# Patient Record
Sex: Female | Born: 1955 | Race: White | Hispanic: Yes | Marital: Married | Smoking: Never smoker
Health system: Southern US, Community
[De-identification: ages and names within clinical notes are randomized; demographics above are authoritative.]

## PROBLEM LIST (undated history)

## (undated) DIAGNOSIS — M1990 Unspecified osteoarthritis, unspecified site: Secondary | ICD-10-CM

## (undated) HISTORY — PX: ABDOMINAL HYSTERECTOMY: SHX81

## (undated) HISTORY — PX: KNEE CARTILAGE SURGERY: SHX688

## (undated) HISTORY — DX: Unspecified osteoarthritis, unspecified site: M19.90

## (undated) HISTORY — PX: TONSILECTOMY/ADENOIDECTOMY WITH MYRINGOTOMY: SHX6125

---

## 1978-07-23 HISTORY — PX: PARTIAL HYSTERECTOMY: SHX80

## 2016-01-25 DIAGNOSIS — E559 Vitamin D deficiency, unspecified: Secondary | ICD-10-CM | POA: Insufficient documentation

## 2016-08-14 DIAGNOSIS — K635 Polyp of colon: Secondary | ICD-10-CM | POA: Insufficient documentation

## 2016-11-22 LAB — HM COLONOSCOPY

## 2019-09-08 LAB — HM MAMMOGRAPHY

## 2020-03-01 ENCOUNTER — Telehealth: Payer: Self-pay

## 2020-03-01 ENCOUNTER — Other Ambulatory Visit: Payer: Self-pay

## 2020-03-01 ENCOUNTER — Ambulatory Visit
Admission: RE | Admit: 2020-03-01 | Discharge: 2020-03-01 | Disposition: A | Discharge status: Home / Self Care | Payer: Medicare Other | Attending: Internal Medicine | Admitting: Internal Medicine

## 2020-03-01 ENCOUNTER — Ambulatory Visit: Payer: Self-pay | Admitting: Internal Medicine

## 2020-03-01 ENCOUNTER — Ambulatory Visit
Admission: RE | Admit: 2020-03-01 | Discharge: 2020-03-01 | Disposition: A | Discharge status: Home / Self Care | Payer: Medicare Other | Source: Ambulatory Visit | Attending: Internal Medicine | Admitting: Internal Medicine

## 2020-03-01 ENCOUNTER — Encounter: Payer: Self-pay | Admitting: Internal Medicine

## 2020-03-01 ENCOUNTER — Ambulatory Visit (INDEPENDENT_AMBULATORY_CARE_PROVIDER_SITE_OTHER): Payer: Medicare Other | Admitting: Internal Medicine

## 2020-03-01 VITALS — BP 122/68 | HR 86 | Ht 62.0 in | Wt 142.0 lb

## 2020-03-01 DIAGNOSIS — Z789 Other specified health status: Secondary | ICD-10-CM

## 2020-03-01 DIAGNOSIS — F5101 Primary insomnia: Secondary | ICD-10-CM | POA: Diagnosis not present

## 2020-03-01 DIAGNOSIS — M545 Low back pain, unspecified: Secondary | ICD-10-CM

## 2020-03-01 DIAGNOSIS — IMO0001 Reserved for inherently not codable concepts without codable children: Secondary | ICD-10-CM | POA: Insufficient documentation

## 2020-03-01 DIAGNOSIS — M810 Age-related osteoporosis without current pathological fracture: Secondary | ICD-10-CM

## 2020-03-01 DIAGNOSIS — M8000XA Age-related osteoporosis with current pathological fracture, unspecified site, initial encounter for fracture: Secondary | ICD-10-CM | POA: Insufficient documentation

## 2020-03-01 DIAGNOSIS — M79672 Pain in left foot: Secondary | ICD-10-CM | POA: Diagnosis not present

## 2020-03-01 DIAGNOSIS — M1712 Unilateral primary osteoarthritis, left knee: Secondary | ICD-10-CM

## 2020-03-01 MED ORDER — BACLOFEN 10 MG PO TABS: 10.0000 mg | ORAL_TABLET | Freq: Every evening | ORAL | 0 refills | Status: DC

## 2020-03-01 MED ORDER — BELSOMRA 10 MG PO TABS: 1.0000 | ORAL_TABLET | Freq: Every evening | ORAL | 0 refills | Status: DC | PRN

## 2020-03-01 NOTE — Progress Notes (Signed)
Date:  03/01/2020   Name:  Roberta Ward   DOB:  [birth year]   MRN:  [id number]   Chief Complaint: Establish Care (New patient. From [place]. ), Back Pain (Lower back pain. Larey Seat off a ladder and landed on her back a year ago. Been getting worse in the last 6 months. Just moved to [place] a month ago and believes the moving aggervated the pain more. Orthopedics fromt he fall did XRAYS - no fracture at the time. ), Foot Pain (X 1 month. Painful on the top of your foot- constant. ), and Insomnia (Difficulty sleeping for at least the last 15 years. Takes magnesium and melatonin. Sometimes takes allergy meds to try to fall asleep. Still does not always help. )  Back Pain This is a chronic problem. The current episode started more than 1 year ago. The problem has been gradually worsening since onset. The pain is present in the lumbar spine. The quality of the pain is described as aching. The pain does not radiate. The pain is moderate. Pertinent negatives include no abdominal pain, chest pain, fever or headaches. Risk factors include history of osteoporosis (started after a fall last year; xrays were negative). She has tried analgesics (tylenol) for the symptoms. The treatment provided mild relief.  Foot Pain This is a chronic problem. The current episode started more than 1 month ago. The problem occurs every several days (left foot). Associated symptoms include arthralgias. Pertinent negatives include no abdominal pain, chest pain, chills, fatigue, fever, headaches, joint swelling or rash. The symptoms are aggravated by walking. She has tried acetaminophen for the symptoms. The treatment provided mild relief.  Insomnia Primary symptoms: sleep disturbance, difficulty falling asleep.  Episode onset: for many years. Past treatments include medication (temazepam years ago; more recently benadryl and melatonin). How long after going to bed to you fall asleep: over an hour.      Review of Systems    Constitutional: Negative for chills, fatigue and fever.  Eyes: Negative for visual disturbance.  Respiratory: Negative for chest tightness and shortness of breath.   Cardiovascular: Negative for chest pain and palpitations.  Gastrointestinal: Negative for abdominal pain, constipation and diarrhea.  Musculoskeletal: Positive for arthralgias, back pain and gait problem. Negative for joint swelling.  Skin: Negative for color change and rash.  Neurological: Negative for dizziness, light-headedness and headaches.  Psychiatric/Behavioral: Positive for sleep disturbance. Negative for dysphoric mood. The patient has insomnia. The patient is not nervous/anxious.     There are no problems to display for this patient.   Allergies  Allergen Reactions  . Sulfa Antibiotics Hives and Itching  . Latex     Past Surgical History:  Procedure Laterality Date  . KNEE CARTILAGE SURGERY Left   . PARTIAL HYSTERECTOMY  1980   Rt ovary still present and cervix still present.  . TONSILECTOMY/ADENOIDECTOMY WITH MYRINGOTOMY      Social History   Tobacco Use  . Smoking status: Never Smoker  . Smokeless tobacco: Never Used  Vaping Use  . Vaping Use: Never used  Substance Use Topics  . Alcohol use: Yes    Comment: rare- occasional  . Drug use: Never     Medication list has been reviewed and updated.  Current Meds  Medication Sig  . Ascorbic Acid (VITAMIN C) 1000 MG tablet Take 1,000 mg by mouth daily.  . Cholecalciferol (VITAMIN D) 50 MCG (2000 UT) CAPS Take by mouth.  . magnesium 30 MG tablet Take 30 mg by mouth  2 (two) times daily.  . Melatonin 10 MG CAPS Take by mouth.    PHQ 2/9 Scores 03/01/2020  PHQ - 2 Score 0  PHQ- 9 Score 6    GAD 7 : Generalized Anxiety Score 03/01/2020  Nervous, Anxious, on Edge 0  Control/stop worrying 0  Worry too much - different things 0  Trouble relaxing 2  Restless 3  Easily annoyed or irritable 0  Afraid - awful might happen 0  Total GAD 7 Score 5   Anxiety Difficulty Not difficult at all    BP Readings from Last 3 Encounters:  03/01/20 122/68    Physical Exam Vitals and nursing note reviewed.  Constitutional:      General: She is not in acute distress.    Appearance: She is well-developed.  HENT:     Head: Normocephalic and atraumatic.  Neck:     Vascular: No carotid bruit.  Cardiovascular:     Rate and Rhythm: Normal rate and regular rhythm.     Pulses: Normal pulses.     Heart sounds: No murmur heard.   Pulmonary:     Effort: Pulmonary effort is normal. No respiratory distress.     Breath sounds: No wheezing or rhonchi.  Musculoskeletal:     Cervical back: Normal range of motion.     Lumbar back: No tenderness or bony tenderness. Negative right straight leg raise test and negative left straight leg raise test.     Right lower leg: No edema.     Left lower leg: No edema.     Left foot: Tenderness (of the mid foot) present. No swelling, deformity or bunion.  Lymphadenopathy:     Cervical: No cervical adenopathy.  Skin:    General: Skin is warm and dry.     Findings: No rash.  Neurological:     General: No focal deficit present.     Mental Status: She is alert and oriented to person, place, and time.     Sensory: Sensation is intact.     Motor: Motor function is intact.     Deep Tendon Reflexes:     Reflex Scores:      Bicep reflexes are 2+ on the right side and 2+ on the left side.      Patellar reflexes are 2+ on the right side and 2+ on the left side. Psychiatric:        Behavior: Behavior normal.        Thought Content: Thought content normal.     Wt Readings from Last 3 Encounters:  03/01/20 142 lb (64.4 kg)    BP 122/68   Pulse 86   Ht 5\' 2"  (1.575 m)   Wt 142 lb (64.4 kg)   SpO2 97%   BMI 25.97 kg/m   Assessment and Plan: 1. Lumbar back pain Will get xrays and add Baclofen every evening If xrays are remarkable, will refer to Ortho - DG Lumbar Spine Complete; Future - baclofen (LIORESAL)  10 MG tablet; Take 1 tablet (10 mg total) by mouth every evening.  Dispense: 30 each; Refill: 0  2. Primary osteoarthritis of left knee S/p surgery x 2  3. Left foot pain Suspect OA vs Morton's neuroma Continue Tylenol as needed If worsening, will refer to Podiatry  4. Primary insomnia Sample of belsomra 10 mg plus coupon - Suvorexant (BELSOMRA) 10 MG TABS; Take 1 tablet by mouth at bedtime as needed.  Dispense: 10 tablet; Refill: 0  5. Age-related osteoporosis without current pathological  fracture Hx of Fosamax treatment Will request records and discuss DEXA at next visit  6. Patient is Jehovah's Witness   Partially dictated using Animal nutrition[employer]. Any errors are unintentional.  Bari Edward, MD [facility]  03/01/2020

## 2020-03-01 NOTE — Telephone Encounter (Signed)
Called patient and left a VM to call back so that I can pre-chart before her new patient appt this afternoon. Also informed her on the Vm that our office does not prescribe any controlled substances or depression/ anxiety controlled.   CM

## 2020-03-02 ENCOUNTER — Other Ambulatory Visit: Payer: Self-pay | Admitting: Internal Medicine

## 2020-03-02 ENCOUNTER — Other Ambulatory Visit: Payer: Self-pay

## 2020-03-02 ENCOUNTER — Encounter: Payer: Self-pay | Admitting: Internal Medicine

## 2020-03-02 ENCOUNTER — Telehealth: Payer: Self-pay

## 2020-03-02 DIAGNOSIS — S32020A Wedge compression fracture of second lumbar vertebra, initial encounter for closed fracture: Secondary | ICD-10-CM

## 2020-03-02 DIAGNOSIS — M79673 Pain in unspecified foot: Secondary | ICD-10-CM

## 2020-03-02 DIAGNOSIS — M545 Low back pain, unspecified: Secondary | ICD-10-CM

## 2020-03-02 NOTE — Telephone Encounter (Signed)
Called pt with results 03/02/2020. Pt stated that she is still having foot pain. I placed RF for podiatry. Visit notes stated that if pt was still having pain a RF could be placed.  KP

## 2020-03-04 ENCOUNTER — Ambulatory Visit: Payer: Self-pay | Admitting: *Deleted

## 2020-03-04 NOTE — Telephone Encounter (Signed)
Patient states she was cleaning the bathroom last Friday- patient forgot to tell PCP at appointment. Patient states she is having pain at night and every time she moves. Advised UC/PCP appointment in 3 days- patient prefers appointment- call to office- appointment scheduled. Advised UC if her pain gets worse.  Reason for Disposition . [1] MODERATE pain (e.g., interferes with normal activities) AND [2] present > 3 days  Answer Assessment - Initial Assessment Questions 1. ONSET: "When did the muscle aches or body pains start?"      1 week ago 2. LOCATION: "What part of your body is hurting?" (e.g., entire body, arms, legs)      Upper R side- under the R breast 3. SEVERITY: "How bad is the pain?" (Scale 1-10; or mild, moderate, severe)   - MILD (1-3): doesn't interfere with normal activities    - MODERATE (4-7): interferes with normal activities or awakens from sleep    - SEVERE (8-10):  excruciating pain, unable to do any normal activities      Moderate /severe 4. CAUSE: "What do you think is causing the pains?"     Muscle pull 5. FEVER: "Have you been having fever?"     no 6. OTHER SYMPTOMS: "Do you have any other symptoms?" (e.g., chest pain, weakness, rash, cold or flu symptoms, weight loss)     no 7. PREGNANCY: "Is there any chance you are pregnant?" "When was your last menstrual period?"     n/a 8. TRAVEL: "Have you traveled out of the country in the last month?" (e.g., travel history, exposures)     no  Protocols used: MUSCLE ACHES AND BODY PAIN-A-AH

## 2020-03-08 ENCOUNTER — Other Ambulatory Visit: Payer: Self-pay

## 2020-03-08 ENCOUNTER — Encounter: Payer: Self-pay | Admitting: Internal Medicine

## 2020-03-08 ENCOUNTER — Ambulatory Visit (INDEPENDENT_AMBULATORY_CARE_PROVIDER_SITE_OTHER): Payer: Medicare Other | Admitting: Internal Medicine

## 2020-03-08 VITALS — BP 142/86 | HR 63 | Temp 98.2°F | Ht 62.0 in | Wt 141.0 lb

## 2020-03-08 DIAGNOSIS — T148XXA Other injury of unspecified body region, initial encounter: Secondary | ICD-10-CM

## 2020-03-08 DIAGNOSIS — S32020A Wedge compression fracture of second lumbar vertebra, initial encounter for closed fracture: Secondary | ICD-10-CM

## 2020-03-08 DIAGNOSIS — M8000XD Age-related osteoporosis with current pathological fracture, unspecified site, subsequent encounter for fracture with routine healing: Secondary | ICD-10-CM | POA: Diagnosis not present

## 2020-03-08 NOTE — Patient Instructions (Addendum)
Kalman Jewels, DPM  Podiatry  [address] [place] [address]  Phone: [phone]  Fax: [phone]    Novella Olive, MD  Orthopedic Surgery  [address] [place] [address]  Phone: [phone]  Fax: [phone]

## 2020-03-08 NOTE — Progress Notes (Signed)
Date:  03/08/2020   Name:  Roberta Ward   DOB:  05-16-1956   MRN:  [id number]   Chief Complaint: right side sprain (X2 weeks ago,right side,tried to reach for something felt like she pulled something, painful, doesnt hurt as bad as it did last week but still has some pain )  Muscle Pain This is a new problem. The current episode started 1 to 4 weeks ago. The problem occurs daily. The problem has been gradually improving since onset. Associated with: was leaning over the tub and reached to pick up something - felt a sharp pull then had pain. Pain location: under right lower anteror ribs. The pain is medium. The symptoms are aggravated by any movement. Pertinent negatives include no chest pain or fever. Past treatments include acetaminophen and rest. The treatment provided mild relief.    No results found for: CREATININE, BUN, NA, K, CL, CO2 No results found for: CHOL, HDL, LDLCALC, LDLDIRECT, TRIG, CHOLHDL No results found for: TSH No results found for: HGBA1C No results found for: WBC, HGB, HCT, MCV, PLT No results found for: ALT, AST, GGT, ALKPHOS, BILITOT   Review of Systems  Constitutional: Negative for chills and fever.  Respiratory: Negative for cough and chest tightness.   Cardiovascular: Negative for chest pain and palpitations.  Gastrointestinal: Negative for abdominal distention.  Musculoskeletal: Positive for myalgias.    Patient Active Problem List   Diagnosis Date Noted  . Closed compression fracture of L2 lumbar vertebra, initial encounter (HCC) 03/02/2020  . Age-related osteoporosis without current pathological fracture 03/01/2020  . Primary insomnia 03/01/2020  . Left foot pain 03/01/2020  . Primary osteoarthritis of left knee 03/01/2020  . Lumbar back pain 03/01/2020  . Patient is Jehovah's Witness 03/01/2020    Allergies  Allergen Reactions  . Sulfa Antibiotics Hives and Itching  . Latex     Past Surgical History:  Procedure Laterality Date  .  KNEE CARTILAGE SURGERY Left   . PARTIAL HYSTERECTOMY  1980   Rt ovary still present and cervix still present.  . TONSILECTOMY/ADENOIDECTOMY WITH MYRINGOTOMY      Social History   Tobacco Use  . Smoking status: Never Smoker  . Smokeless tobacco: Never Used  Vaping Use  . Vaping Use: Never used  Substance Use Topics  . Alcohol use: Yes    Comment: rare- occasional  . Drug use: Never     Medication list has been reviewed and updated.  Current Meds  Medication Sig  . Ascorbic Acid (VITAMIN C) 1000 MG tablet Take 1,000 mg by mouth daily.  . baclofen (LIORESAL) 10 MG tablet Take 1 tablet (10 mg total) by mouth every evening.  . Cholecalciferol (VITAMIN D) 50 MCG (2000 UT) CAPS Take by mouth.  . magnesium 30 MG tablet Take 30 mg by mouth 2 (two) times daily.  . Melatonin 10 MG CAPS Take by mouth.  . Suvorexant (BELSOMRA) 10 MG TABS Take 1 tablet by mouth at bedtime as needed.    PHQ 2/9 Scores 03/01/2020  PHQ - 2 Score 0  PHQ- 9 Score 6    GAD 7 : Generalized Anxiety Score 03/01/2020  Nervous, Anxious, on Edge 0  Control/stop worrying 0  Worry too much - different things 0  Trouble relaxing 2  Restless 3  Easily annoyed or irritable 0  Afraid - awful might happen 0  Total GAD 7 Score 5  Anxiety Difficulty Not difficult at all    BP Readings from Last 3 Encounters:  03/08/20 (!) 142/86  03/01/20 122/68    Physical Exam Vitals and nursing note reviewed.  Constitutional:      General: She is not in acute distress.    Appearance: She is well-developed.  HENT:     Head: Normocephalic and atraumatic.  Cardiovascular:     Rate and Rhythm: Normal rate and regular rhythm.  Pulmonary:     Effort: Pulmonary effort is normal. No respiratory distress.     Breath sounds: No wheezing or rhonchi.  Abdominal:     General: Abdomen is flat.     Palpations: Abdomen is soft.     Tenderness: There is abdominal tenderness (under right ribs).     Comments: No mass or bruising  noted Minimal discomfort to palpation  Musculoskeletal:        General: Normal range of motion.  Skin:    General: Skin is warm and dry.     Findings: No rash.  Neurological:     Mental Status: She is alert and oriented to person, place, and time.  Psychiatric:        Behavior: Behavior normal.        Thought Content: Thought content normal.     Wt Readings from Last 3 Encounters:  03/08/20 141 lb (64 kg)  03/01/20 142 lb (64.4 kg)    BP (!) 142/86   Pulse 63   Temp 98.2 F (36.8 C) (Oral)   Ht 5\' 2"  (1.575 m)   Wt 141 lb (64 kg)   SpO2 96%   BMI 25.79 kg/m   Assessment and Plan: 1. Muscle strain Improving with conservative care Continue tylenol as needed  2. Closed compression fracture of L2 lumbar vertebra, initial encounter (HCC) Slowly improving low back pain Seeing Orthopedics in one week - Dr. at [facility]  3. Age-related osteoporosis with current pathological fracture with routine healing, subsequent encounter Continue vitamin D supplement   Partially dictated using [facility]. Any errors are unintentional.  Animal nutritionist, MD [facility]  03/08/2020

## 2020-03-13 ENCOUNTER — Other Ambulatory Visit: Payer: Self-pay | Admitting: Internal Medicine

## 2020-03-13 DIAGNOSIS — F5101 Primary insomnia: Secondary | ICD-10-CM

## 2020-03-13 NOTE — Telephone Encounter (Signed)
Requested medication (s) are due for refill today: yes  Requested medication (s) are on the active medication list: yes  Last refill:  03/01/20  Future visit scheduled: yes  Notes to clinic:  med not assigned to a protocol-review manually   Requested Prescriptions  Pending Prescriptions Disp Refills   BELSOMRA 10 MG TABS [Pharmacy Med Name: Belsomra 10 MG Oral Tablet] 10 tablet 0    Sig: Take 1 tablet by mouth at bedtime as needed.      Off-Protocol Failed - 03/13/2020 10:08 AM      Failed - Medication not assigned to a protocol, review manually.      Passed - Valid encounter within last 12 months    Recent Outpatient Visits           5 days ago Muscle strain   [facility] Reubin Milan, MD   1 week ago Lumbar back pain   [facility] Reubin Milan, MD       Future Appointments             In 3 months Judithann Graves Nyoka Cowden, MD [facility]

## 2020-03-14 ENCOUNTER — Telehealth: Payer: Self-pay | Admitting: Internal Medicine

## 2020-03-14 NOTE — Telephone Encounter (Signed)
Copied from CRM [id number]. Topic: General - Inquiry >> Mar 14, 2020 11:34 AM Floria Raveling A wrote: Reason for CRM: pt called and stated she needs a coupon for the BELSOMRA 10 MG TABS [[id number]] .  She stated this med is over $100 and can not afford this .  Please advise  Best number  [phone]

## 2020-03-14 NOTE — Telephone Encounter (Signed)
Called pt told her that we did not have anymore coupons for Belsomra. Told pt that the coupon she used should be good for 6 months to a year. Also told pt that a new RF was sent in today that she should try to call the pharmacy to see if the coupon will be good for the Rfs that was sent in 03/14/2020. Pt verbalized understanding.  KP

## 2020-04-01 DIAGNOSIS — R7303 Prediabetes: Secondary | ICD-10-CM | POA: Insufficient documentation

## 2020-05-11 ENCOUNTER — Ambulatory Visit (INDEPENDENT_AMBULATORY_CARE_PROVIDER_SITE_OTHER): Payer: Medicare Other

## 2020-05-11 DIAGNOSIS — Z0000 Encounter for general adult medical examination without abnormal findings: Secondary | ICD-10-CM

## 2020-05-11 NOTE — Patient Instructions (Signed)
Roberta Ward , Thank you for taking time to come for your Medicare Wellness Visit. I appreciate your ongoing commitment to your health goals. Please review the following plan we discussed and let me know if I can assist you in the future.   Screening recommendations/referrals: Colonoscopy: records needed Mammogram: done 03/03/19 Bone Density: records needed Recommended yearly ophthalmology/optometry visit for glaucoma screening and checkup Recommended yearly dental visit for hygiene and checkup  Vaccinations: Influenza vaccine: due Pneumococcal vaccine: due Tdap vaccine: due Shingles vaccine: records needed   Covid-19:done 09/21/19, 10/11/19 & 04/30/20  Advanced directives: Please bring a copy of your health care power of attorney and living will to the office at your convenience or we can provide new paperwork if needed.   Conditions/risks identified: Recommend increasing physical activity   Next appointment: Follow up in one year for your annual wellness visit    Preventive Care 65 Years and Older, Female Preventive care refers to lifestyle choices and visits with your health care provider that can promote health and wellness. What does preventive care include?  A yearly physical exam. This is also called an annual well check.  Dental exams once or twice a year.  Routine eye exams. Ask your health care provider how often you should have your eyes checked.  Personal lifestyle choices, including:  Daily care of your teeth and gums.  Regular physical activity.  Eating a healthy diet.  Avoiding tobacco and drug use.  Limiting alcohol use.  Practicing safe sex.  Taking low-dose aspirin every day.  Taking vitamin and mineral supplements as recommended by your health care provider. What happens during an annual well check? The services and screenings done by your health care provider during your annual well check will depend on your age, overall health, lifestyle risk  factors, and family history of disease. Counseling  Your health care provider may ask you questions about your:  Alcohol use.  Tobacco use.  Drug use.  Emotional well-being.  Home and relationship well-being.  Sexual activity.  Eating habits.  History of falls.  Memory and ability to understand (cognition).  Work and work Astronomer.  Reproductive health. Screening  You may have the following tests or measurements:  Height, weight, and BMI.  Blood pressure.  Lipid and cholesterol levels. These may be checked every 5 years, or more frequently if you are over 74 years old.  Skin check.  Lung cancer screening. You may have this screening every year starting at age 86 if you have a 30-pack-year history of smoking and currently smoke or have quit within the past 15 years.  Fecal occult blood test (FOBT) of the stool. You may have this test every year starting at age 63.  Flexible sigmoidoscopy or colonoscopy. You may have a sigmoidoscopy every 5 years or a colonoscopy every 10 years starting at age 31.  Hepatitis C blood test.  Hepatitis B blood test.  Sexually transmitted disease (STD) testing.  Diabetes screening. This is done by checking your blood sugar (glucose) after you have not eaten for a while (fasting). You may have this done every 1-3 years.  Bone density scan. This is done to screen for osteoporosis. You may have this done starting at age 21.  Mammogram. This may be done every 1-2 years. Talk to your health care provider about how often you should have regular mammograms. Talk with your health care provider about your test results, treatment options, and if necessary, the need for more tests. Vaccines  Your health  care provider may recommend certain vaccines, such as:  Influenza vaccine. This is recommended every year.  Tetanus, diphtheria, and acellular pertussis (Tdap, Td) vaccine. You may need a Td booster every 10 years.  Zoster vaccine. You  may need this after age 65.  Pneumococcal 13-valent conjugate (PCV13) vaccine. One dose is recommended after age 82.  Pneumococcal polysaccharide (PPSV23) vaccine. One dose is recommended after age 62. Talk to your health care provider about which screenings and vaccines you need and how often you need them. This information is not intended to replace advice given to you by your health care provider. Make sure you discuss any questions you have with your health care provider. Document Released: 08/05/2015 Document Revised: 03/28/2016 Document Reviewed: 05/10/2015 Elsevier Interactive Patient Education  2017 [employer].  Fall Prevention in the Home Falls can cause injuries. They can happen to people of all ages. There are many things you can do to make your home safe and to help prevent falls. What can I do on the outside of my home?  Regularly fix the edges of walkways and driveways and fix any cracks.  Remove anything that might make you trip as you walk through a door, such as a raised step or threshold.  Trim any bushes or trees on the path to your home.  Use bright outdoor lighting.  Clear any walking paths of anything that might make someone trip, such as rocks or tools.  Regularly check to see if handrails are loose or broken. Make sure that both sides of any steps have handrails.  Any raised decks and porches should have guardrails on the edges.  Have any leaves, snow, or ice cleared regularly.  Use sand or salt on walking paths during winter.  Clean up any spills in your garage right away. This includes oil or grease spills. What can I do in the bathroom?  Use night lights.  Install grab bars by the toilet and in the tub and shower. Do not use towel bars as grab bars.  Use non-skid mats or decals in the tub or shower.  If you need to sit down in the shower, use a plastic, non-slip stool.  Keep the floor dry. Clean up any water that spills on the floor as soon as  it happens.  Remove soap buildup in the tub or shower regularly.  Attach bath mats securely with double-sided non-slip rug tape.  Do not have throw rugs and other things on the floor that can make you trip. What can I do in the bedroom?  Use night lights.  Make sure that you have a light by your bed that is easy to reach.  Do not use any sheets or blankets that are too big for your bed. They should not hang down onto the floor.  Have a firm chair that has side arms. You can use this for support while you get dressed.  Do not have throw rugs and other things on the floor that can make you trip. What can I do in the kitchen?  Clean up any spills right away.  Avoid walking on wet floors.  Keep items that you use a lot in easy-to-reach places.  If you need to reach something above you, use a strong step stool that has a grab bar.  Keep electrical cords out of the way.  Do not use floor polish or wax that makes floors slippery. If you must use wax, use non-skid floor wax.  Do not have  throw rugs and other things on the floor that can make you trip. What can I do with my stairs?  Do not leave any items on the stairs.  Make sure that there are handrails on both sides of the stairs and use them. Fix handrails that are broken or loose. Make sure that handrails are as long as the stairways.  Check any carpeting to make sure that it is firmly attached to the stairs. Fix any carpet that is loose or worn.  Avoid having throw rugs at the top or bottom of the stairs. If you do have throw rugs, attach them to the floor with carpet tape.  Make sure that you have a light switch at the top of the stairs and the bottom of the stairs. If you do not have them, ask someone to add them for you. What else can I do to help prevent falls?  Wear shoes that:  Do not have high heels.  Have rubber bottoms.  Are comfortable and fit you well.  Are closed at the toe. Do not wear sandals.  If  you use a stepladder:  Make sure that it is fully opened. Do not climb a closed stepladder.  Make sure that both sides of the stepladder are locked into place.  Ask someone to hold it for you, if possible.  Clearly mark and make sure that you can see:  Any grab bars or handrails.  First and last steps.  Where the edge of each step is.  Use tools that help you move around (mobility aids) if they are needed. These include:  Canes.  Walkers.  Scooters.  Crutches.  Turn on the lights when you go into a dark area. Replace any light bulbs as soon as they burn out.  Set up your furniture so you have a clear path. Avoid moving your furniture around.  If any of your floors are uneven, fix them.  If there are any pets around you, be aware of where they are.  Review your medicines with your doctor. Some medicines can make you feel dizzy. This can increase your chance of falling. Ask your doctor what other things that you can do to help prevent falls. This information is not intended to replace advice given to you by your health care provider. Make sure you discuss any questions you have with your health care provider. Document Released: 05/05/2009 Document Revised: 12/15/2015 Document Reviewed: 08/13/2014 Elsevier Interactive Patient Education  2017 [employer].

## 2020-05-11 NOTE — Progress Notes (Signed)
Subjective:   Roberta Ward is a 64 y.o. female who presents for an Initial Medicare Annual Wellness Visit.  Virtual Visit via Telephone Note  I connected with  Roberta Ward on 05/11/20 at  8:00 AM EDT by telephone and verified that I am speaking with the correct person using two identifiers.  Medicare Annual Wellness visit completed telephonically due to Covid-19 pandemic.   Location: Patient: home Provider: [facility]   I discussed the limitations, risks, security and privacy concerns of performing an evaluation and management service by telephone and the availability of in person appointments. The patient expressed understanding and agreed to proceed.  Unable to perform video visit due to video visit attempted and failed and/or patient does not have video capability.   Some vital signs may be absent or patient reported.   [name], LPN   Review of Systems     Cardiac Risk Factors include: advanced age (>33men, >69 women)     Objective:    There were no vitals filed for this visit. There is no height or weight on file to calculate BMI.  No flowsheet data found.  Current Medications (verified) Outpatient Encounter Medications as of 05/11/2020  Medication Sig  . Ascorbic Acid (VITAMIN C) 1000 MG tablet Take 1,000 mg by mouth daily.  . Cholecalciferol (VITAMIN D) 50 MCG (2000 UT) CAPS Take by mouth.  . magnesium 30 MG tablet Take 30 mg by mouth 2 (two) times daily.  . Melatonin 10 MG CAPS Take by mouth.  . [DISCONTINUED] baclofen (LIORESAL) 10 MG tablet Take 1 tablet (10 mg total) by mouth every evening.  . [DISCONTINUED] BELSOMRA 10 MG TABS TAKE 1 TABLET BY MOUTH AT BEDTIME AS NEEDED   No facility-administered encounter medications on file as of 05/11/2020.    Allergies (verified) Sulfa antibiotics and Latex   History: History reviewed. No pertinent past medical history. Past Surgical History:  Procedure Laterality Date  . KNEE CARTILAGE SURGERY Left   .  PARTIAL HYSTERECTOMY  1980   Rt ovary still present and cervix still present.  . TONSILECTOMY/ADENOIDECTOMY WITH MYRINGOTOMY     Family History  Problem Relation Age of Onset  . Breast cancer Mother    Social History   Socioeconomic History  . Marital status: Married    Spouse name: [place]   . Number of children: 1  . Years of education: Not on file  . Highest education level: Not on file  Occupational History  . Not on file  Tobacco Use  . Smoking status: Never Smoker  . Smokeless tobacco: Never Used  Vaping Use  . Vaping Use: Never used  Substance and Sexual Activity  . Alcohol use: Yes    Comment: rare- occasional  . Drug use: Never  . Sexual activity: Not Currently  Other Topics Concern  . Not on file  Social History Narrative  . Not on file   Social Determinants of Health   Financial Resource Strain: Low Risk   . Difficulty of Paying Living Expenses: Not hard at all  Food Insecurity: No Food Insecurity  . Worried About Programme researcher, broadcasting/film/video in the Last Year: Never true  . Ran Out of Food in the Last Year: Never true  Transportation Needs: No Transportation Needs  . Lack of Transportation (Medical): No  . Lack of Transportation (Non-Medical): No  Physical Activity: Inactive  . Days of Exercise per Week: 0 days  . Minutes of Exercise per Session: 0 min  Stress: No Stress Concern  Present  . Feeling of Stress : Only a little  Social Connections: Unknown  . Frequency of Communication with Friends and Family: More than three times a week  . Frequency of Social Gatherings with Friends and Family: More than three times a week  . Attends Religious Services: Not on file  . Active Member of Clubs or Organizations: No  . Attends Banker Meetings: Never  . Marital Status: Married    Tobacco Counseling Counseling given: Not Answered   Clinical Intake:  Pre-visit preparation completed: Yes  Pain : No/denies pain     Nutritional Risks:  None Diabetes: No  How often do you need to have someone help you when you read instructions, pamphlets, or other written materials from your doctor or pharmacy?: 1 - Never    Interpreter Needed?: No  Information entered by :: [name] LPN   Activities of Daily Living In your present state of health, do you have any difficulty performing the following activities: 05/11/2020 03/01/2020  Hearing? N N  Comment declines hearing aids -  Vision? N N  Difficulty concentrating or making decisions? N N  Walking or climbing stairs? N N  Dressing or bathing? N N  Doing errands, shopping? N N  Preparing Food and eating ? N -  Using the Toilet? N -  In the past six months, have you accidently leaked urine? N -  Do you have problems with loss of bowel control? N -  Managing your Medications? N -  Managing your Finances? N -  Housekeeping or managing your Housekeeping? N -    Patient Care Team: Reubin Milan, MD as PCP - General (Internal Medicine)  Indicate any recent Medical Services you may have received from other than [facility] providers in the past year (date may be approximate).     Assessment:   This is a routine wellness examination for [place].  Hearing/Vision screen  Hearing Screening   125Hz  250Hz  500Hz  1000Hz  2000Hz  3000Hz  4000Hz  6000Hz  8000Hz   Right ear:           Left ear:           Comments: Pt denies hearing difficulty  Vision Screening Comments: Pt new to area plans to establish care with new provider  Dietary issues and exercise activities discussed: Current Exercise Habits: The patient does not participate in regular exercise at present, Exercise limited by: None identified  Goals   None    Depression Screen PHQ 2/9 Scores 05/11/2020 03/01/2020  PHQ - 2 Score 0 0  PHQ- 9 Score - 6    Fall Risk Fall Risk  05/11/2020 03/01/2020  Falls in the past year? 0 1  Number falls in past yr: 0 0  Injury with Fall? 0 1  Risk for fall due to : No Fall Risks  History of fall(s)  Follow up Falls prevention discussed Falls evaluation completed    Any stairs in or around the home? No If so, are there any without handrails? No  Home free of loose throw rugs in walkways, pet beds, electrical cords, etc? Yes  Adequate lighting in your home to reduce risk of falls? Yes   ASSISTIVE DEVICES UTILIZED TO PREVENT FALLS:  Life alert? No  Use of a cane, walker or w/c? No  Grab bars in the bathroom? Yes  Shower chair or bench in shower? No  Elevated toilet seat or a handicapped toilet? No   TIMED UP AND GO:  Was the test performed? No .  Telephonic visit.   Cognitive Function: 6CIT deferred; pt states no memory issues        Immunizations Immunization History  Administered Date(s) Administered  . PFIZER SARS-COV-2 Vaccination 09/21/2019, 10/11/2019, 04/30/2020    TDAP status: Due, Education has been provided regarding the importance of this vaccine. Advised may receive this vaccine at local pharmacy or Health Dept. Aware to provide a copy of the vaccination record if obtained from local pharmacy or Health Dept. Verbalized acceptance and understanding.   Flu vaccine status: due for 2021-2022  Pneumococcal vaccine status: Declined,  Education has been provided regarding the importance of this vaccine but patient still declined. Advised may receive this vaccine at local pharmacy or Health Dept. Aware to provide a copy of the vaccination record if obtained from local pharmacy or Health Dept. Verbalized acceptance and understanding.    Covid-19 vaccine status: Completed vaccines  Qualifies for Shingles Vaccine? Yes   Zostavax completed No   Shingrix Completed?: Yes  Screening Tests Health Maintenance  Topic Date Due  . Hepatitis C Screening  Never done  . COLONOSCOPY  Never done  . DEXA SCAN  Never done  . PNA vac Low Risk Adult (1 of 2 - PCV13) Never done  . INFLUENZA VACCINE  10/20/2020 (Originally 02/21/2020)  . TETANUS/TDAP  03/08/2021  (Originally [birth year]1973)  . MAMMOGRAM  03/02/2021  . COVID-19 Vaccine  Completed    Health Maintenance  Health Maintenance Due  Topic Date Due  . Hepatitis C Screening  Never done  . COLONOSCOPY  Never done  . DEXA SCAN  Never done  . PNA vac Low Risk Adult (1 of 2 - PCV13) Never done   Colorectal cancer screening, mammogram and bone density all completed per patient; awaiting records for results.   Lung Cancer Screening: (Low Dose CT Chest recommended if Age 12-80 years, 30 pack-year currently smoking OR have quit w/in 15years.) does not qualify.   Additional Screening:  Hepatitis C Screening: does qualify; postponed  Vision Screening: Recommended annual ophthalmology exams for early detection of glaucoma and other disorders of the eye. Is the patient up to date with their annual eye exam?  Yes  Who is the provider or what is the name of the office in which the patient attends annual eye exams? Eye provider in [facility]; pt to establish care with new provider here  Dental Screening: Recommended annual dental exams for proper oral hygiene  Community Resource Referral / Chronic Care Management: CRR required this visit?  No   CCM required this visit?  No      Plan:     I have personally reviewed and noted the following in the patient's chart:   . Medical and social history . Use of alcohol, tobacco or illicit drugs  . Current medications and supplements . Functional ability and status . Nutritional status . Physical activity . Advanced directives . List of other physicians . Hospitalizations, surgeries, and ER visits in previous 12 months . Vitals . Screenings to include cognitive, depression, and falls . Referrals and appointments  In addition, I have reviewed and discussed with patient certain preventive protocols, quality metrics, and best practice recommendations. A written personalized care plan for preventive services as well as general preventive health  recommendations were provided to patient.     [name], LPN   [id number]   Nurse Notes: pt scheduled for CPE on 07/05/20; pt aware we do not have records from previous provider

## 2020-07-05 ENCOUNTER — Other Ambulatory Visit: Payer: Self-pay

## 2020-07-05 ENCOUNTER — Encounter: Payer: Self-pay | Admitting: Internal Medicine

## 2020-07-05 ENCOUNTER — Ambulatory Visit (INDEPENDENT_AMBULATORY_CARE_PROVIDER_SITE_OTHER): Payer: Medicare Other | Admitting: Internal Medicine

## 2020-07-05 VITALS — BP 132/82 | HR 71 | Temp 98.3°F | Ht 62.0 in | Wt 144.0 lb

## 2020-07-05 DIAGNOSIS — M8000XD Age-related osteoporosis with current pathological fracture, unspecified site, subsequent encounter for fracture with routine healing: Secondary | ICD-10-CM | POA: Diagnosis not present

## 2020-07-05 DIAGNOSIS — R7303 Prediabetes: Secondary | ICD-10-CM | POA: Diagnosis not present

## 2020-07-05 DIAGNOSIS — Z1231 Encounter for screening mammogram for malignant neoplasm of breast: Secondary | ICD-10-CM

## 2020-07-05 DIAGNOSIS — E559 Vitamin D deficiency, unspecified: Secondary | ICD-10-CM | POA: Diagnosis not present

## 2020-07-05 DIAGNOSIS — Z1159 Encounter for screening for other viral diseases: Secondary | ICD-10-CM

## 2020-07-05 DIAGNOSIS — R5383 Other fatigue: Secondary | ICD-10-CM

## 2020-07-05 DIAGNOSIS — Z23 Encounter for immunization: Secondary | ICD-10-CM | POA: Diagnosis not present

## 2020-07-05 DIAGNOSIS — Z13220 Encounter for screening for lipoid disorders: Secondary | ICD-10-CM

## 2020-07-05 DIAGNOSIS — M8088XA Other osteoporosis with current pathological fracture, vertebra(e), initial encounter for fracture: Secondary | ICD-10-CM

## 2020-07-05 NOTE — Progress Notes (Signed)
Date:  07/05/2020   Name:  Christian Treadway   DOB:  February 09, 1956   MRN:  [id number]   Chief Complaint: Annual Exam (Breast exam no pap)  Roberta Ward is a 64 y.o. female who presents today for her Complete Annual Exam. She feels poorly. She reports exercising none. She reports she is sleeping poorly. Breast complaints none.  Mammogram: 08/2019 [facility] Gower DEXA: no report Pap smear: discontinued Colonoscopy: 11/2016 repeat three years  Immunization History  Administered Date(s) Administered  . PFIZER SARS-COV-2 Vaccination 09/21/2019, 10/11/2019, 04/30/2020    Back Pain This is a chronic problem. The problem has been gradually worsening since onset. The pain is present in the lumbar spine. The pain radiates to the left thigh. Pertinent negatives include no abdominal pain, chest pain, dysuria, fever or headaches. Risk factors include history of osteoporosis.  OP - noted to have a fairly new compression fracture in September.  It was healing on its own so no intervention was recommended.  She was seen by Ortho - exercises at home were planned but she did not keep up with them.   No results found for: CREATININE, BUN, NA, K, CL, CO2 No results found for: CHOL, HDL, LDLCALC, LDLDIRECT, TRIG, CHOLHDL No results found for: TSH No results found for: HGBA1C No results found for: WBC, HGB, HCT, MCV, PLT No results found for: ALT, AST, GGT, ALKPHOS, BILITOT   Review of Systems  Constitutional: Positive for fatigue. Negative for chills, fever and unexpected weight change.  HENT: Negative for congestion, hearing loss, tinnitus, trouble swallowing and voice change.   Eyes: Negative for visual disturbance.  Respiratory: Negative for cough, chest tightness, shortness of breath and wheezing.   Cardiovascular: Negative for chest pain, palpitations and leg swelling.  Gastrointestinal: Negative for abdominal pain, constipation, diarrhea and vomiting.  Endocrine: Negative for polydipsia and  polyuria.  Genitourinary: Negative for dysuria, frequency, genital sores, vaginal bleeding and vaginal discharge.  Musculoskeletal: Positive for arthralgias, back pain and joint swelling (joint pain- not a new problem- left knee and lower back ). Negative for gait problem.  Skin: Negative for color change and rash.  Neurological: Negative for dizziness, tremors, light-headedness and headaches.  Hematological: Negative for adenopathy. Does not bruise/bleed easily.  Psychiatric/Behavioral: Positive for sleep disturbance (uses over the counter medication). Negative for dysphoric mood. The patient is not nervous/anxious.     Patient Active Problem List   Diagnosis Date Noted  . Prediabetes 04/01/2020  . Closed compression fracture of L2 lumbar vertebra, initial encounter (HCC) 03/02/2020  . Age-related osteoporosis with current pathological fracture 03/01/2020  . Primary insomnia 03/01/2020  . Left foot pain 03/01/2020  . Primary osteoarthritis of left knee 03/01/2020  . Lumbar back pain 03/01/2020  . Patient is Jehovah's Witness 03/01/2020  . Colon polyp 08/14/2016  . Vitamin D deficiency 01/25/2016    Allergies  Allergen Reactions  . Sulfa Antibiotics Hives and Itching  . Latex     Past Surgical History:  Procedure Laterality Date  . KNEE CARTILAGE SURGERY Left   . PARTIAL HYSTERECTOMY  1980   Rt ovary still present and cervix still present.  . TONSILECTOMY/ADENOIDECTOMY WITH MYRINGOTOMY      Social History   Tobacco Use  . Smoking status: Never Smoker  . Smokeless tobacco: Never Used  Vaping Use  . Vaping Use: Never used  Substance Use Topics  . Alcohol use: Yes    Comment: rare- occasional  . Drug use: Never     Medication list has  been reviewed and updated.  Current Meds  Medication Sig  . Ascorbic Acid (VITAMIN C) 1000 MG tablet Take 1,000 mg by mouth daily.  . Chlorpheniramine Maleate (ALLERGY PO) Take by mouth.  . Cholecalciferol (VITAMIN D) 50 MCG (2000  UT) CAPS Take by mouth.  . magnesium 30 MG tablet Take 30 mg by mouth 2 (two) times daily.  . Melatonin 10 MG CAPS Take by mouth.    PHQ 2/9 Scores 07/05/2020 05/11/2020 03/01/2020  PHQ - 2 Score 0 0 0  PHQ- 9 Score 0 - 6    GAD 7 : Generalized Anxiety Score 07/05/2020 03/01/2020  Nervous, Anxious, on Edge 0 0  Control/stop worrying 0 0  Worry too much - different things 0 0  Trouble relaxing 0 2  Restless 0 3  Easily annoyed or irritable 0 0  Afraid - awful might happen 0 0  Total GAD 7 Score 0 5  Anxiety Difficulty - Not difficult at all    BP Readings from Last 3 Encounters:  07/05/20 132/82  03/08/20 (!) 142/86  03/01/20 122/68    Physical Exam Vitals and nursing note reviewed.  Constitutional:      General: She is not in acute distress.    Appearance: She is well-developed.  HENT:     Head: Normocephalic and atraumatic.     Right Ear: Tympanic membrane and ear canal normal.     Left Ear: Tympanic membrane and ear canal normal.     Nose:     Right Sinus: No maxillary sinus tenderness.     Left Sinus: No maxillary sinus tenderness.  Eyes:     General: No scleral icterus.       Right eye: No discharge.        Left eye: No discharge.     Conjunctiva/sclera: Conjunctivae normal.  Neck:     Thyroid: No thyromegaly.     Vascular: No carotid bruit.  Cardiovascular:     Rate and Rhythm: Normal rate and regular rhythm.  No extrasystoles are present.    Pulses: Normal pulses.     Heart sounds: Normal heart sounds. No murmur heard.   Pulmonary:     Effort: Pulmonary effort is normal. No respiratory distress.     Breath sounds: Normal breath sounds. No wheezing.  Chest:  Breasts:     Right: No mass, nipple discharge, skin change or tenderness.     Left: No mass, nipple discharge, skin change or tenderness.    Abdominal:     General: Bowel sounds are normal.     Palpations: Abdomen is soft.     Tenderness: There is no abdominal tenderness.  Musculoskeletal:      Cervical back: Normal range of motion. No erythema.     Lumbar back: Bony tenderness present. Decreased range of motion. Positive left straight leg raise test. Negative right straight leg raise test.     Right knee: Normal.     Left knee: Normal.     Right lower leg: No edema.     Left lower leg: No edema.  Lymphadenopathy:     Cervical: No cervical adenopathy.  Skin:    General: Skin is warm and dry.     Findings: No rash.  Neurological:     Mental Status: She is alert and oriented to person, place, and time.     Cranial Nerves: No cranial nerve deficit.     Sensory: No sensory deficit.     Deep Tendon Reflexes: Reflexes are  normal and symmetric.  Psychiatric:        Attention and Perception: Attention normal.        Mood and Affect: Mood normal.     Wt Readings from Last 3 Encounters:  07/05/20 144 lb (65.3 kg)  03/08/20 141 lb (64 kg)  03/01/20 142 lb (64.4 kg)    BP 132/82   Pulse 71   Temp 98.3 F (36.8 C) (Oral)   Ht 5\' 2"  (1.575 m)   Wt 144 lb (65.3 kg)   SpO2 95%   BMI 26.34 kg/m   Assessment and Plan: 1. Age-related osteoporosis with current pathological fracture with routine healing, subsequent encounter Recommend repeat DEXA Calcium and vitamin D daily Need to consider referral for infusion therapy or Prolia  2. Prediabetes Check labs and advise Healthy low carb diet recommended - Comprehensive metabolic panel - Hemoglobin A1c  3. Vitamin D deficiency Supplement orally - will advise on dose change - VITAMIN D 25 Hydroxy (Vit-D Deficiency, Fractures)  4. Screening for lipid disorders - Lipid panel  5. Encounter for screening mammogram for breast cancer Done in February at outside institution - MM 3D SCREEN BREAST BILATERAL; Future  6. Need for hepatitis C screening test - Hepatitis C antibody  7. Fatigue, unspecified type Unclear etiology - may be due to ongoing back and hip pain - CBC with Differential/Platelet - TSH  8. Other  osteoporosis with current pathological fracture, vertebra(e), initial encounter for fracture (HCC)  - DG Bone Density; Future   Partially dictated using March. Any errors are unintentional.  Animal nutritionist, MD [facility]  07/05/2020

## 2020-07-05 NOTE — Patient Instructions (Addendum)
  Marlena Clipper, MD  [address]  [facility]  [place], [place] [address]  [phone] (Work)  [phone] (Fax)

## 2020-07-06 ENCOUNTER — Encounter: Payer: Self-pay | Admitting: Internal Medicine

## 2020-07-06 DIAGNOSIS — E785 Hyperlipidemia, unspecified: Secondary | ICD-10-CM | POA: Insufficient documentation

## 2020-07-06 LAB — CBC WITH DIFFERENTIAL/PLATELET
Basophils Absolute: 0 10*3/uL (ref 0.0–0.2)
Basos: 1 %
EOS (ABSOLUTE): 0.4 10*3/uL (ref 0.0–0.4)
Eos: 7 %
Hematocrit: 43.5 % (ref 34.0–46.6)
Hemoglobin: 15.1 g/dL (ref 11.1–15.9)
Immature Grans (Abs): 0 10*3/uL (ref 0.0–0.1)
Immature Granulocytes: 0 %
Lymphocytes Absolute: 1.7 10*3/uL (ref 0.7–3.1)
Lymphs: 30 %
MCH: 33.6 pg — ABNORMAL HIGH (ref 26.6–33.0)
MCHC: 34.7 g/dL (ref 31.5–35.7)
MCV: 97 fL (ref 79–97)
Monocytes Absolute: 0.5 10*3/uL (ref 0.1–0.9)
Monocytes: 8 %
Neutrophils Absolute: 3.1 10*3/uL (ref 1.4–7.0)
Neutrophils: 54 %
Platelets: 252 10*3/uL (ref 150–450)
RBC: 4.49 x10E6/uL (ref 3.77–5.28)
RDW: 12 % (ref 11.7–15.4)
WBC: 5.8 10*3/uL (ref 3.4–10.8)

## 2020-07-06 LAB — COMPREHENSIVE METABOLIC PANEL
ALT: 39 IU/L — ABNORMAL HIGH (ref 0–32)
AST: 30 IU/L (ref 0–40)
Albumin/Globulin Ratio: 2.1 (ref 1.2–2.2)
Albumin: 4.5 g/dL (ref 3.8–4.8)
Alkaline Phosphatase: 67 IU/L (ref 44–121)
BUN/Creatinine Ratio: 18 (ref 12–28)
BUN: 13 mg/dL (ref 8–27)
Bilirubin Total: 0.4 mg/dL (ref 0.0–1.2)
CO2: 21 mmol/L (ref 20–29)
Calcium: 9.6 mg/dL (ref 8.7–10.3)
Chloride: 105 mmol/L (ref 96–106)
Creatinine, Ser: 0.73 mg/dL (ref 0.57–1.00)
GFR calc Af Amer: 99 mL/min/{1.73_m2} (ref >59)
GFR calc non Af Amer: 86 mL/min/{1.73_m2} (ref >59)
Globulin, Total: 2.1 g/dL (ref 1.5–4.5)
Glucose: 103 mg/dL — ABNORMAL HIGH (ref 65–99)
Potassium: 4.5 mmol/L (ref 3.5–5.2)
Sodium: 142 mmol/L (ref 134–144)
Total Protein: 6.6 g/dL (ref 6.0–8.5)

## 2020-07-06 LAB — VITAMIN D 25 HYDROXY (VIT D DEFICIENCY, FRACTURES): Vit D, 25-Hydroxy: 35.6 ng/mL (ref 30.0–100.0)

## 2020-07-06 LAB — LIPID PANEL
Chol/HDL Ratio: 3.7 ratio (ref 0.0–4.4)
Cholesterol, Total: 203 mg/dL — ABNORMAL HIGH (ref 100–199)
HDL: 55 mg/dL (ref >39)
LDL Chol Calc (NIH): 124 mg/dL — ABNORMAL HIGH (ref 0–99)
Triglycerides: 133 mg/dL (ref 0–149)
VLDL Cholesterol Cal: 24 mg/dL (ref 5–40)

## 2020-07-06 LAB — HEMOGLOBIN A1C
Est. average glucose Bld gHb Est-mCnc: 123 mg/dL
Hgb A1c MFr Bld: 5.9 % — ABNORMAL HIGH (ref 4.8–5.6)

## 2020-07-06 LAB — TSH: TSH: 2.1 u[IU]/mL (ref 0.450–4.500)

## 2020-07-06 LAB — HEPATITIS C ANTIBODY: Hep C Virus Ab: <0.1 s/co ratio (ref 0.0–0.9)

## 2020-07-26 ENCOUNTER — Other Ambulatory Visit: Payer: Self-pay | Admitting: Sports Medicine

## 2020-07-26 DIAGNOSIS — G8929 Other chronic pain: Secondary | ICD-10-CM

## 2020-07-26 DIAGNOSIS — M5136 Other intervertebral disc degeneration, lumbar region: Secondary | ICD-10-CM

## 2020-08-05 ENCOUNTER — Other Ambulatory Visit: Payer: Self-pay

## 2020-08-05 ENCOUNTER — Ambulatory Visit
Admission: RE | Admit: 2020-08-05 | Discharge: 2020-08-05 | Disposition: A | Discharge status: Home / Self Care | Payer: Medicare Other | Source: Ambulatory Visit | Attending: Sports Medicine | Admitting: Sports Medicine

## 2020-08-05 DIAGNOSIS — M5136 Other intervertebral disc degeneration, lumbar region: Secondary | ICD-10-CM | POA: Insufficient documentation

## 2020-08-05 DIAGNOSIS — M5442 Lumbago with sciatica, left side: Secondary | ICD-10-CM | POA: Insufficient documentation

## 2020-08-05 DIAGNOSIS — G8929 Other chronic pain: Secondary | ICD-10-CM | POA: Diagnosis present

## 2020-08-23 DEATH — deceased

## 2020-09-12 ENCOUNTER — Ambulatory Visit
Admission: RE | Admit: 2020-09-12 | Discharge: 2020-09-12 | Disposition: A | Discharge status: Home / Self Care | Payer: Medicare Other | Source: Ambulatory Visit | Attending: Internal Medicine | Admitting: Internal Medicine

## 2020-09-12 ENCOUNTER — Other Ambulatory Visit: Payer: Self-pay

## 2020-09-12 DIAGNOSIS — Z1231 Encounter for screening mammogram for malignant neoplasm of breast: Secondary | ICD-10-CM | POA: Insufficient documentation

## 2020-09-12 DIAGNOSIS — M8088XA Other osteoporosis with current pathological fracture, vertebra(e), initial encounter for fracture: Secondary | ICD-10-CM | POA: Diagnosis present

## 2020-09-19 ENCOUNTER — Ambulatory Visit: Payer: Medicare Other | Admitting: Internal Medicine

## 2020-09-20 ENCOUNTER — Encounter: Payer: Self-pay | Admitting: Internal Medicine

## 2020-09-20 ENCOUNTER — Ambulatory Visit (INDEPENDENT_AMBULATORY_CARE_PROVIDER_SITE_OTHER): Payer: Medicare Other | Admitting: Internal Medicine

## 2020-09-20 ENCOUNTER — Other Ambulatory Visit: Payer: Self-pay

## 2020-09-20 VITALS — BP 134/76 | HR 73 | Ht 62.0 in | Wt 143.0 lb

## 2020-09-20 DIAGNOSIS — N898 Other specified noninflammatory disorders of vagina: Secondary | ICD-10-CM | POA: Diagnosis not present

## 2020-09-20 DIAGNOSIS — K219 Gastro-esophageal reflux disease without esophagitis: Secondary | ICD-10-CM | POA: Insufficient documentation

## 2020-09-20 DIAGNOSIS — M8000XD Age-related osteoporosis with current pathological fracture, unspecified site, subsequent encounter for fracture with routine healing: Secondary | ICD-10-CM | POA: Diagnosis not present

## 2020-09-20 DIAGNOSIS — Z23 Encounter for immunization: Secondary | ICD-10-CM

## 2020-09-20 LAB — POCT WET PREP WITH KOH
KOH Prep POC: NEGATIVE
RBC Wet Prep HPF POC: 0
Trichomonas, UA: NEGATIVE

## 2020-09-20 MED ORDER — METRONIDAZOLE 500 MG PO TABS: 500.0000 mg | ORAL_TABLET | Freq: Two times a day (BID) | ORAL | 0 refills | Status: DC

## 2020-09-20 MED ORDER — OMEPRAZOLE 20 MG PO CPDR
20.0000 mg | DELAYED_RELEASE_CAPSULE | Freq: Every day | ORAL | 1 refills | Status: DC
Start: 2020-09-20 — End: 2021-02-03

## 2020-09-20 NOTE — Progress Notes (Signed)
Date:  09/20/2020   Name:  Roberta Ward   DOB:  Jun 02, 1956   MRN:  [id number]   Chief Complaint: Osteoporosis (Patient wants to discuss other options besides oral treatment. ), Gastroesophageal Reflux (Started over a month ago. Takes OTC antiacid but not helping. Having to take everyday. ), Immunizations (Had pneumonia vaccine last year with [facility] [place]. ), and Vaginal Discharge (Wants to discuss having excessive discharge. Wants to make sure this is normal. )  Gastroesophageal Reflux She complains of heartburn. She reports no abdominal pain, no chest pain, no coughing, no dysphagia or no wheezing. This is a chronic problem. The problem occurs constantly. The problem has been waxing and waning. The heartburn is located in the substernum. Pertinent negatives include no fatigue. She has tried a histamine-2 antagonist for the symptoms. The treatment provided mild relief.  Vaginal Discharge The patient's primary symptoms include a genital odor and vaginal discharge. The patient's pertinent negatives include no genital itching, genital lesions or genital rash. This is a recurrent problem. Associated symptoms include back pain (chronic, getting ESI). Pertinent negatives include no abdominal pain, chills, constipation, diarrhea, dysuria, fever, headaches or hematuria.  Osteoporosis - has been on bisphosphonates in the past.  Recent DEXA - OP.  Lab Results  Component Value Date   CREATININE 0.73 07/05/2020   BUN 13 07/05/2020   NA 142 07/05/2020   K 4.5 07/05/2020   CL 105 07/05/2020   CO2 21 07/05/2020   Lab Results  Component Value Date   CHOL 203 (H) 07/05/2020   HDL 55 07/05/2020   LDLCALC 124 (H) 07/05/2020   TRIG 133 07/05/2020   CHOLHDL 3.7 07/05/2020   Lab Results  Component Value Date   TSH 2.100 07/05/2020   Lab Results  Component Value Date   HGBA1C 5.9 (H) 07/05/2020   Lab Results  Component Value Date   WBC 5.8 07/05/2020   HGB 15.1 07/05/2020   HCT 43.5  07/05/2020   MCV 97 07/05/2020   PLT 252 07/05/2020   Lab Results  Component Value Date   ALT 39 (H) 07/05/2020   AST 30 07/05/2020   ALKPHOS 67 07/05/2020   BILITOT 0.4 07/05/2020     Review of Systems  Constitutional: Negative for chills, fatigue, fever and unexpected weight change.  Respiratory: Negative for cough, chest tightness, shortness of breath and wheezing.   Cardiovascular: Negative for chest pain, palpitations and leg swelling.  Gastrointestinal: Positive for heartburn. Negative for abdominal pain, blood in stool, constipation, diarrhea and dysphagia.       Reflux  Genitourinary: Positive for vaginal discharge. Negative for dysuria, genital sores and hematuria.  Musculoskeletal: Positive for arthralgias and back pain (chronic, getting ESI).  Neurological: Negative for dizziness and headaches.    Patient Active Problem List   Diagnosis Date Noted  . Mild hyperlipidemia 07/06/2020  . Prediabetes 04/01/2020  . Closed compression fracture of L2 lumbar vertebra, initial encounter (HCC) 03/02/2020  . Age-related osteoporosis with current pathological fracture 03/01/2020  . Primary insomnia 03/01/2020  . Left foot pain 03/01/2020  . Primary osteoarthritis of left knee 03/01/2020  . Lumbar back pain 03/01/2020  . Patient is Jehovah's Witness 03/01/2020  . Colon polyp 08/14/2016  . Vitamin D deficiency 01/25/2016    Allergies  Allergen Reactions  . Sulfa Antibiotics Hives and Itching  . Latex     Past Surgical History:  Procedure Laterality Date  . ABDOMINAL HYSTERECTOMY    . KNEE CARTILAGE SURGERY Left   .  PARTIAL HYSTERECTOMY  1980   Rt ovary still present and cervix still present.  . TONSILECTOMY/ADENOIDECTOMY WITH MYRINGOTOMY      Social History   Tobacco Use  . Smoking status: Never Smoker  . Smokeless tobacco: Never Used  Vaping Use  . Vaping Use: Never used  Substance Use Topics  . Alcohol use: Yes    Comment: rare- occasional  . Drug use:  Never     Medication list has been reviewed and updated.  No outpatient medications have been marked as taking for the 09/20/20 encounter (Office Visit) with Reubin Milan, MD.    [facility] 2/9 Scores 07/05/2020 05/11/2020 03/01/2020  PHQ - 2 Score 0 0 0  PHQ- 9 Score 0 - 6    GAD 7 : Generalized Anxiety Score 07/05/2020 03/01/2020  Nervous, Anxious, on Edge 0 0  Control/stop worrying 0 0  Worry too much - different things 0 0  Trouble relaxing 0 2  Restless 0 3  Easily annoyed or irritable 0 0  Afraid - awful might happen 0 0  Total GAD 7 Score 0 5  Anxiety Difficulty - Not difficult at all    BP Readings from Last 3 Encounters:  09/20/20 134/76  07/05/20 132/82  03/08/20 (!) 142/86    Physical Exam Vitals and nursing note reviewed.  Constitutional:      General: She is not in acute distress.    Appearance: Normal appearance. She is well-developed.  HENT:     Head: Normocephalic and atraumatic.  Cardiovascular:     Rate and Rhythm: Normal rate and regular rhythm.     Pulses: Normal pulses.     Heart sounds: No murmur heard.   Pulmonary:     Effort: Pulmonary effort is normal. No respiratory distress.     Breath sounds: No wheezing or rhonchi.  Abdominal:     Palpations: Abdomen is soft.     Tenderness: There is no abdominal tenderness. There is no guarding or rebound.     Hernia: No hernia is present.  Genitourinary:    Labia:        Right: No tenderness or injury.        Left: No tenderness, lesion or injury.      Vagina: Vaginal discharge and erythema present.  Musculoskeletal:     Cervical back: Normal range of motion.     Right lower leg: No edema.     Left lower leg: No edema.  Skin:    General: Skin is warm and dry.     Findings: No rash.  Neurological:     Mental Status: She is alert and oriented to person, place, and time.  Psychiatric:        Mood and Affect: Mood normal.        Behavior: Behavior normal.     Wt Readings from Last 3  Encounters:  09/20/20 143 lb (64.9 kg)  07/05/20 144 lb (65.3 kg)  03/08/20 141 lb (64 kg)    BP 134/76   Pulse 73   Ht 5\' 2"  (1.575 m)   Wt 143 lb (64.9 kg)   SpO2 96%   BMI 26.16 kg/m   Assessment and Plan: 1. Age-related osteoporosis with current pathological fracture with routine healing, subsequent encounter S/p 5 yrs of oral treatment - stopped after 5 years then had extensive dental work so never pursued restarting Needs to consider alternative treatment to oral medications - Ambulatory referral to Endocrinology  2. Vaginal discharge C/w BV -  will treat accordingly.  Follow up if no resolution. - POCT Wet Prep with KOH - metroNIDAZOLE (FLAGYL) 500 MG tablet; Take 1 tablet (500 mg total) by mouth 2 (two) times daily for 7 days.  Dispense: 14 tablet; Refill: 0  3. Gastroesophageal reflux disease, unspecified whether esophagitis present Frequent gerd sx without any alarm symptoms. Will try bid PPI for one month then discontinue - follow up if sx recur - omeprazole (PRILOSEC) 20 MG capsule; Take 1 capsule (20 mg total) by mouth daily.  Dispense: 60 capsule; Refill: 1  Prevnar-13 given today. Partially dictated using Animal nutritionist. Any errors are unintentional.  Bari Edward, MD [facility]  09/20/2020

## 2020-09-20 NOTE — Patient Instructions (Addendum)
Take the omeprazole twice a day for one month.  If symptoms are resolved, then stop the medication.  Call if symptoms recur.  Vaginosis bacteriana Bacterial Vaginosis  La vaginosis bacteriana es una infeccin que ocurre cuando cambia el equilibrio normal de las bacterias que se encuentran en la vagina. La causa de este cambio es la proliferacin excesiva de ciertas bacterias en la vagina. La vaginosis bacteriana es la infeccin vaginal ms frecuente [employer] de 15 a 44aos. Esta afeccin aumenta el riesgo de tener infecciones de transmisin sexual (ITS). El tratamiento puede ayudar a Software engineer. El tratamiento es muy importante para las mujeres embarazadas porque esta afeccin puede provocar que los bebs nazcan antes de tiempo (prematuramente) o con bajo peso. Cules son las causas? Esta afeccin se origina por un aumento de bacterias nocivas que, generalmente, estn presentes en cantidades pequeas en la vagina. No obstante, no se conoce el motivo exacto de la aparicin de esta afeccin. El contagio no se produce en baos, por ropas de cama, en piscinas ni por contacto con objetos. Qu incrementa el riesgo? Los siguientes factores pueden hacer que sea ms propenso a Aeronautical engineer afeccin:  Tener una nueva pareja sexual o mltiples parejas sexuales, o tener relaciones sexuales sin proteccin.  Hacerse duchas vaginales.  Tener colocado un dispositivo intrauterino(DIU).  Fumar.  Consumir drogas y alcohol en exceso. Esto puede llevar a una conducta sexual ms riesgosa.  Tomar ciertos antibiticos.  Estar embarazada. Cules son los signos o sntomas? Algunas mujeres con esta afeccin no manifiestan ningn sntoma. Entre los sntomas, se pueden incluir los siguientes:  Secrecin vaginal de color gris o blanco. La secrecin puede ser acuosa o espumosa.  Secrecin vaginal con olor similar al [employer]; en especial, despus de Art gallery manager sexuales o durante la  menstruacin.  Picazn en la vagina y alrededor de esta.  Ardor o dolor al [employer]. Cmo se diagnostica? Esta afeccin se diagnostica en funcin de lo siguiente:  Sus antecedentes mdicos.  Examen fsico de la vagina.  Anlisis de [place] de lquido vaginal para detectar bacterias nocivas o clulas anormales. Cmo se trata? Esta afeccin se trata con antibiticos. Podran administrarse en forma de pastillas, de una crema vaginal o de un medicamento que se coloca dentro de la vagina(vulo vaginal). Si la afeccin se repite despus del tratamiento, podra indicarse una segunda tanda de antibiticos. Siga estas instrucciones en su casa: Medicamentos  Tome o aplquese los medicamentos de venta libre y los recetados solamente como se lo haya indicado el mdico.  Tome los antibiticos o aplqueselos como se lo haya indicado el mdico. No deje de usar el antibitico aunque comience a Actor. Instrucciones generales  Si tiene una pareja sexual mujer, avsele que sufre una infeccin vaginal. Samson Frederic debe concurrir a visitas de control con el mdico. Si tiene una pareja sexual hombre, l no necesita tratamiento.  Evite la actividad sexual hasta que haya finalizado el [place].  Beba suficiente lquido como para Pharmacologist la orina de color [place] plido.  Mantenga limpia la zona que rodea la vagina y Administrator. ? Lave la zona diariamente con agua tibia. ? Cuando vaya al bao, siempre higiencese desde adelante hacia atrs.  Si est amamantando, hable con su mdico acerca de Regulatory affairs officer.  Cumpla con todas las visitas de seguimiento. Esto es importante. Cmo se previene? Autocuidado  No se haga duchas vaginales.  Higiencese la parte exterior de la vagina solo con agua tibia.  Use ropa  interior de algodn o con revestimiento de algodn.  No use pantalones ni pantis ajustados; en especial, durante el verano. Sexo seguro  Utilice  proteccin cuando Retail banker. Esto puede comprender lo siguiente: ? Utilizar condn. ? Utilizar barrera bucal. Se trata de una fina capa de un material hecho de ltex o poliuretano que protege la boca durante el sexo oral.  Limite el nmero de parejas sexuales que tiene. Para prevenir la vaginosis bacteriana, es mejor [employer] Materials sexuales solo con IT trainer mongama).  Es importante que usted y su pareja sexual se realicen estudios de deteccin de ITS. Drogas y alcohol  No consuma ningn producto que contenga nicotina o tabaco. Estos productos incluyen cigarrillos, tabaco para Theatre manager y aparatos de vapeo, como los Administrator, Civil Service. Si necesita ayuda para dejar de fumar, consulte al mdico.  No consuma drogas.  No beba alcohol si: ? El mdico le indica que no lo haga. ? Est embarazada, puede estar embarazada o est tratando de quedar embarazada.  Si bebe alcohol: ? Limite la cantidad que consume de 0 a 1 medida por da. ? Est atenta a la cantidad de alcohol que hay en las bebidas que toma. En los [place], una medida equivale a una botella de cerveza de 12oz ( ), un vaso de vino de 5oz ( ) o un vaso de una bebida alcohlica de alta graduacin de 1oz (46ml). Dnde buscar ms informacin  Centers for Disease Control and Prevention (Centros para el Control y la Prevencin de Event organiser): FootballExhibition.com.br  American Sexual Health Association Designer, jewellery) (Asociacin Estadounidense de la Salud Sexual): www.ashastd.org  U.S. Department of Health and [employer], Office on [address] (Departamento de Salud y [place] de los [place], [place] de Salud de la Mujer): http://hoffman.com/ Comunquese con un mdico si:  Los sntomas no mejoran, ni siquiera despus del [place].  Tiene ms secrecin o siente dolor al [employer]ill[employer].  Tiene fiebre o escalofros.  Siente dolor en el abdomen o la pelvis.  Siente dolor  durante las [employer] [place] [place] [employer].  Tiene sangrado vaginal entre los periodos [employer] and Company. Resumen  La vaginosis bacteriana es una infeccin vaginal que ocurre cuando cambia el equilibrio normal de las bacterias que se encuentran en la vagina. Es el resultado de un crecimiento excesivo de ciertas bacterias.  Esta afeccin aumenta el riesgo de tener infecciones de transmisin sexual (ITS). Recibir tratamiento puede ayudar a reducir [employer].  El tratamiento es muy importante para las mujeres embarazadas porque esta afeccin puede provocar que los bebs nazcan antes de tiempo (prematuramente) o con bajo peso.  Esta afeccin se trata con antibiticos. Podran administrarse en forma de pastillas, de una crema vaginal o de un medicamento que se coloca dentro de la vagina(vulo vaginal). Esta informacin no tiene Theme park manager el consejo del mdico. Asegrese de hacerle al mdico cualquier pregunta que tenga. Document Revised: 02/10/2020 Document Reviewed: 02/10/2020 Elsevier Patient Education  2021 [employer].

## 2021-02-01 ENCOUNTER — Ambulatory Visit: Payer: Self-pay | Admitting: *Deleted

## 2021-02-01 ENCOUNTER — Telehealth: Payer: Self-pay

## 2021-02-01 NOTE — Telephone Encounter (Signed)
Patient called to schedule appt with PCP. C/o feeling anxious, pressure in chest that comes and goes but she denies chest pain, difficulty breathing. C/o her body aches and she "just doesn't feel right". Denies any other symptoms no fever, no N/V/D. No sweating. C/o not sleeping well. Has been taking magnesium and melatonin for sleep. C/o hand "shaking " esp. When she is lifting light weight things and shaking in hands stop when she lifts heavier things. Appt scheduled for 02/03/21. Care advise given. Patient verbalized understanding of care advise and to call back or go to [facility] or ED if symptoms worsen.

## 2021-02-01 NOTE — Telephone Encounter (Signed)
Reason for Disposition  Symptoms interfere with sleep  Answer Assessment - Initial Assessment Questions 1. CONCERN: "Did anything happen that prompted you to call today?"      Wants appt due to anxiety, Hands "shaking" body aches, "Just doesn't feel right". 2. ANXIETY SYMPTOMS: "Can you describe how you (your loved one; patient) have been feeling?" (e.g., tense, restless, panicky, anxious, keyed up, overwhelmed, sense of impending doom).      Denies, tense, restless, overwhelmed, reports feeling chest pressure no pain and comes and goes 3. ONSET: "How long have you been feeling this way?" (e.g., hours, days, weeks)     Better today but has been a while 4. SEVERITY: "How would you rate the level of anxiety?" (e.g., 0 - 10; or mild, moderate, severe).     Ma  5. FUNCTIONAL IMPAIRMENT: "How have these feelings affected your ability to do daily activities?" "Have you had more difficulty than usual doing your normal daily activities?" (e.g., getting better, same, worse; self-care, school, work, interactions)     Does not interfere with daily living 6. HISTORY: "Have you felt this way before?" "Have you ever been diagnosed with an anxiety problem in the past?" (e.g., generalized anxiety disorder, panic attacks, PTSD). If Yes, ask: "How was this problem treated?" (e.g., medicines, counseling, etc.)     Was treated for anxiety before 7. RISK OF HARM - SUICIDAL IDEATION: "Do you ever have thoughts of hurting or killing yourself?" If Yes, ask:  "Do you have these feelings now?" "Do you have a plan on how you would do this?"     Na  8. TREATMENT:  "What has been done so far to treat this anxiety?" (e.g., medicines, relaxation strategies). "What has helped?"     na 9. TREATMENT - THERAPIST: "Do you have a counselor or therapist? Name?"     na 10. POTENTIAL TRIGGERS: "Do you drink caffeinated beverages (e.g., coffee, colas, teas), and how much daily?" "Do you drink alcohol or use any drugs?" "Have you  started any new medicines recently?"     none 10. PATIENT SUPPORT: "Who is with you now?" "Who do you live with?" "Do you have family or friends who you can talk to?"        na 11. OTHER SYMPTOMS: "Do you have any other symptoms?" (e.g., feeling depressed, trouble concentrating, trouble sleeping, trouble breathing, palpitations or fast heartbeat, chest pain, sweating, nausea, or diarrhea)       Hands shakes when picking up things that are light and shaking stops when pick up something heavy 12. PREGNANCY: "Is there any chance you are pregnant?" "When was your last menstrual period?"       na  Protocols used: Anxiety and Panic Attack-A-AH

## 2021-02-01 NOTE — Telephone Encounter (Signed)
Copied from CRM [id number]. Topic: Appointment Scheduling - Scheduling Inquiry for Clinic >> Feb 01, 2021 12:59 PM Leafy Ro wrote: Reason for CRM: Pt has moved to [place] area and does not want to go to travel to [facility] and would like to know it she can get est with new provider elisa payne

## 2021-02-02 NOTE — Telephone Encounter (Signed)
Pt called and given message .  Pt verbalized understanding.

## 2021-02-02 NOTE — Telephone Encounter (Addendum)
Called and left vm to let know that we are not accepting new pt's at this time.

## 2021-02-03 ENCOUNTER — Encounter: Payer: Self-pay | Admitting: Internal Medicine

## 2021-02-03 ENCOUNTER — Other Ambulatory Visit: Payer: Self-pay

## 2021-02-03 ENCOUNTER — Ambulatory Visit (INDEPENDENT_AMBULATORY_CARE_PROVIDER_SITE_OTHER): Payer: Medicare Other | Admitting: Internal Medicine

## 2021-02-03 VITALS — BP 142/64 | HR 79 | Temp 98.2°F | Ht 62.0 in | Wt 140.0 lb

## 2021-02-03 DIAGNOSIS — R251 Tremor, unspecified: Secondary | ICD-10-CM | POA: Diagnosis not present

## 2021-02-03 DIAGNOSIS — N898 Other specified noninflammatory disorders of vagina: Secondary | ICD-10-CM

## 2021-02-03 DIAGNOSIS — F321 Major depressive disorder, single episode, moderate: Secondary | ICD-10-CM | POA: Insufficient documentation

## 2021-02-03 DIAGNOSIS — R0789 Other chest pain: Secondary | ICD-10-CM

## 2021-02-03 DIAGNOSIS — K219 Gastro-esophageal reflux disease without esophagitis: Secondary | ICD-10-CM

## 2021-02-03 DIAGNOSIS — M2550 Pain in unspecified joint: Secondary | ICD-10-CM

## 2021-02-03 DIAGNOSIS — M8000XD Age-related osteoporosis with current pathological fracture, unspecified site, subsequent encounter for fracture with routine healing: Secondary | ICD-10-CM

## 2021-02-03 DIAGNOSIS — R7303 Prediabetes: Secondary | ICD-10-CM

## 2021-02-03 DIAGNOSIS — F39 Unspecified mood [affective] disorder: Secondary | ICD-10-CM | POA: Diagnosis not present

## 2021-02-03 MED ORDER — ESCITALOPRAM OXALATE 10 MG PO TABS: 10.0000 mg | ORAL_TABLET | Freq: Every day | ORAL | 1 refills | Status: DC

## 2021-02-03 MED ORDER — OMEPRAZOLE 20 MG PO CPDR: 20.0000 mg | DELAYED_RELEASE_CAPSULE | Freq: Every day | ORAL | 1 refills | Status: DC

## 2021-02-03 MED ORDER — METRONIDAZOLE 500 MG PO TABS: 500.0000 mg | ORAL_TABLET | Freq: Two times a day (BID) | ORAL | 0 refills | Status: AC

## 2021-02-03 NOTE — Progress Notes (Signed)
Date:  02/03/2021   Name:  Roberta Ward   DOB:  Oct 18, 1955   MRN:  [id number]   Chief Complaint: Anxiety (X2 months, anxiety, hands "shaking", body aches, comes and goes.)  Anxiety Presents for initial visit. Onset was more than 5 years ago. The problem has been unchanged. Symptoms include excessive worry and nervous/anxious behavior. Patient reports no chest pain, decreased concentration, dizziness, irritability, palpitations, panic, restlessness, shortness of breath or suicidal ideas. Symptoms occur occasionally.   Tremor - long standing mild tremor, never treated, mainly in hands.  Worsening tremor so it is hard to write.  No head tremor.  No weakness.  Chest heaviness - two days ago chest felt heavy all day.  She had to lie down and try to relax.  No SOB, palpitations, tachycardia.  No sx yesterday.  Joint pain - she has random fleeting joint pains all over.  She was seen for shoulder pain and got joint injection and referral to PT.  Ortho suggested she might have fibromyalgia.  She has an appointment with Rheumatology later this month.  OP - now also has a stress fracture of her left foot.  She is wearing a boot.  Plans to start Prolia next week.  Vaginal discharge - treated in March for BV and resolved.  Now feels like sx are recurring. No new sexual activity, hot tub or spa use, swimming. Lab Results  Component Value Date   CREATININE 0.73 07/05/2020   BUN 13 07/05/2020   NA 142 07/05/2020   K 4.5 07/05/2020   CL 105 07/05/2020   CO2 21 07/05/2020   Lab Results  Component Value Date   CHOL 203 (H) 07/05/2020   HDL 55 07/05/2020   LDLCALC 124 (H) 07/05/2020   TRIG 133 07/05/2020   CHOLHDL 3.7 07/05/2020   Lab Results  Component Value Date   TSH 2.100 07/05/2020   Lab Results  Component Value Date   HGBA1C 5.9 (H) 07/05/2020   Lab Results  Component Value Date   WBC 5.8 07/05/2020   HGB 15.1 07/05/2020   HCT 43.5 07/05/2020   MCV 97 07/05/2020   PLT 252  07/05/2020   Lab Results  Component Value Date   ALT 39 (H) 07/05/2020   AST 30 07/05/2020   ALKPHOS 67 07/05/2020   BILITOT 0.4 07/05/2020     Review of Systems  Constitutional:  Positive for chills and fatigue. Negative for fever, irritability and unexpected weight change.  HENT:  Negative for trouble swallowing.   Respiratory:  Positive for chest tightness. Negative for cough, shortness of breath and wheezing.   Cardiovascular:  Negative for chest pain and palpitations.  Genitourinary:  Positive for vaginal discharge.  Musculoskeletal:  Positive for arthralgias and gait problem. Negative for myalgias and neck pain.  Neurological:  Negative for dizziness, light-headedness and headaches.  Psychiatric/Behavioral:  Negative for decreased concentration and suicidal ideas. The patient is nervous/anxious.    Patient Active Problem List   Diagnosis Date Noted   Gastroesophageal reflux disease 09/20/2020   Mild hyperlipidemia 07/06/2020   Prediabetes 04/01/2020   Closed compression fracture of L2 lumbar vertebra, initial encounter (HCC) 03/02/2020   Age-related osteoporosis with current pathological fracture 03/01/2020   Primary insomnia 03/01/2020   Left foot pain 03/01/2020   Primary osteoarthritis of left knee 03/01/2020   Lumbar back pain 03/01/2020   Patient is Jehovah's Witness 03/01/2020   Colon polyp 08/14/2016   Vitamin D deficiency 01/25/2016    Allergies  Allergen  Reactions   Sulfa Antibiotics Hives and Itching   Latex     Past Surgical History:  Procedure Laterality Date   ABDOMINAL HYSTERECTOMY     KNEE CARTILAGE SURGERY Left    PARTIAL HYSTERECTOMY  1980   Rt ovary still present and cervix still present.   TONSILECTOMY/ADENOIDECTOMY WITH MYRINGOTOMY      Social History   Tobacco Use   Smoking status: Never   Smokeless tobacco: Never  Vaping Use   Vaping Use: Never used  Substance Use Topics   Alcohol use: Yes    Comment: rare- occasional   Drug  use: Never     Medication list has been reviewed and updated.  Current Meds  Medication Sig   Cholecalciferol (VITAMIN D) 50 MCG (2000 UT) CAPS Take by mouth.   magnesium oxide (MAG-OX) 400 MG tablet Take by mouth.   Melatonin 10 MG CAPS Take by mouth.   naproxen (NAPROSYN) 500 MG tablet Take 500 mg by mouth 2 (two) times daily as needed.   omeprazole (PRILOSEC) 20 MG capsule Take 1 capsule (20 mg total) by mouth daily.   [DISCONTINUED] Chlorpheniramine Maleate (ALLERGY PO) Take by mouth.   [DISCONTINUED] gabapentin (NEURONTIN) 300 MG capsule Take 300 mg by mouth at bedtime.   [DISCONTINUED] magnesium 30 MG tablet Take 30 mg by mouth 2 (two) times daily.   [DISCONTINUED] naproxen (NAPROSYN) 500 MG tablet 1 po bid prn   [DISCONTINUED] traMADol (ULTRAM) 50 MG tablet Take 50 mg by mouth every 6 (six) hours as needed.    PHQ 2/9 Scores 02/03/2021 07/05/2020 05/11/2020 03/01/2020  PHQ - 2 Score 4 0 0 0  PHQ- 9 Score 11 0 - 6    GAD 7 : Generalized Anxiety Score 02/03/2021 07/05/2020 03/01/2020  Nervous, Anxious, on Edge 3 0 0  Control/stop worrying 2 0 0  Worry too much - different things 2 0 0  Trouble relaxing 2 0 2  Restless 0 0 3  Easily annoyed or irritable 0 0 0  Afraid - awful might happen 0 0 0  Total GAD 7 Score 9 0 5  Anxiety Difficulty Not difficult at all - Not difficult at all    BP Readings from Last 3 Encounters:  02/03/21 (!) 142/64  09/20/20 134/76  07/05/20 132/82    Physical Exam Vitals and nursing note reviewed.  Constitutional:      General: She is not in acute distress.    Appearance: Normal appearance. She is well-developed.  HENT:     Head: Normocephalic and atraumatic.  Neck:     Vascular: No carotid bruit.  Cardiovascular:     Rate and Rhythm: Normal rate and regular rhythm.     Pulses: Normal pulses.     Heart sounds: No murmur heard. Pulmonary:     Effort: Pulmonary effort is normal. No respiratory distress.     Breath sounds: No wheezing  or rhonchi.  Musculoskeletal:     Cervical back: Normal range of motion.     Right lower leg: No edema.     Comments: No fibromyalgia trigger points identified  Lymphadenopathy:     Cervical: No cervical adenopathy.  Skin:    General: Skin is warm and dry.     Capillary Refill: Capillary refill takes less than 2 seconds.     Findings: No rash.  Neurological:     General: No focal deficit present.     Mental Status: She is alert and oriented to person, place, and time.  Motor: Tremor (intention tremor; not at rest) present.     Coordination: Coordination is intact. Coordination normal. Finger-Nose-Finger Test normal.     Gait: Gait is intact.     Deep Tendon Reflexes:     Reflex Scores:      Bicep reflexes are 2+ on the right side and 2+ on the left side. Psychiatric:        Mood and Affect: Mood normal.        Behavior: Behavior normal.    Wt Readings from Last 3 Encounters:  02/03/21 140 lb (63.5 kg)  09/20/20 143 lb (64.9 kg)  07/05/20 144 lb (65.3 kg)    BP (!) 142/64 (BP Location: Right Arm, Patient Position: Sitting, Cuff Size: Normal)   Pulse 79   Temp 98.2 F (36.8 C) (Oral)   Ht 5\' 2"  (1.575 m)   Wt 140 lb (63.5 kg)   SpO2 98%   BMI 25.61 kg/m   Assessment and Plan: 1. Chest heaviness Sx lasted one day and then resolved No other worrisome sx - EKG 12-Lead - NSR @ 79, possible LAE otherwise normal; no prior to compare  2. Tremor Slightly worsening.  Consider Neurology referral. Appears to be benign tremor. - TSH + free T4  3. Mood disorder (HCC) Causing significant distress and likely contributing to somatic complaints Begin Lexapro  Follow up in 6 weeks - escitalopram (LEXAPRO) 10 MG tablet; Take 1 tablet (10 mg total) by mouth daily.  Dispense: 30 tablet; Refill: 1  4. Vaginal discharge - metroNIDAZOLE (FLAGYL) 500 MG tablet; Take 1 tablet (500 mg total) by mouth 2 (two) times daily for 7 days.  Dispense: 14 tablet; Refill: 0  5.  Gastroesophageal reflux disease, unspecified whether esophagitis present Symptoms well controlled on daily PPI No red flag signs such as weight loss, n/v, melena Will continue omeprazole. - omeprazole (PRILOSEC) 20 MG capsule; Take 1 capsule (20 mg total) by mouth daily.  Dispense: 60 capsule; Refill: 1 - CBC with Differential/Platelet  6. Pain, joint, multiple sites Seeing Rheumatology in the near future No trigger point tenderness to suggest Fibromyalgia  7. Prediabetes Check labs and advise - Hemoglobin A1c - Basic metabolic panel  8. Age-related osteoporosis with current pathological fracture with routine healing, subsequent encounter Seeing Endo for Prolia treatments to start next week. Agree with OP treatment given recent fracture and decline in bone density   Partially dictated using . Any errors are unintentional.  Animal nutritionist, MD [facility]  02/03/2021

## 2021-02-03 NOTE — Patient Instructions (Signed)
Start Lexapro 1/2 tablet daily for 6 days then take a whole tablet

## 2021-02-04 LAB — CBC WITH DIFFERENTIAL/PLATELET
Basophils Absolute: 0.1 10*3/uL (ref 0.0–0.2)
Basos: 0 %
EOS (ABSOLUTE): 0.4 10*3/uL (ref 0.0–0.4)
Eos: 3 %
Hematocrit: 41.2 % (ref 34.0–46.6)
Hemoglobin: 13.9 g/dL (ref 11.1–15.9)
Immature Grans (Abs): 0 10*3/uL (ref 0.0–0.1)
Immature Granulocytes: 0 %
Lymphocytes Absolute: 1.9 10*3/uL (ref 0.7–3.1)
Lymphs: 16 %
MCH: 32 pg (ref 26.6–33.0)
MCHC: 33.7 g/dL (ref 31.5–35.7)
MCV: 95 fL (ref 79–97)
Monocytes Absolute: 0.8 10*3/uL (ref 0.1–0.9)
Monocytes: 6 %
Neutrophils Absolute: 9.1 10*3/uL — ABNORMAL HIGH (ref 1.4–7.0)
Neutrophils: 75 %
Platelets: 275 10*3/uL (ref 150–450)
RBC: 4.35 x10E6/uL (ref 3.77–5.28)
RDW: 12.8 % (ref 11.7–15.4)
WBC: 12.2 10*3/uL — ABNORMAL HIGH (ref 3.4–10.8)

## 2021-02-04 LAB — BASIC METABOLIC PANEL
BUN/Creatinine Ratio: 27 (ref 12–28)
BUN: 26 mg/dL (ref 8–27)
CO2: 23 mmol/L (ref 20–29)
Calcium: 10.3 mg/dL (ref 8.7–10.3)
Chloride: 102 mmol/L (ref 96–106)
Creatinine, Ser: 0.98 mg/dL (ref 0.57–1.00)
Glucose: 96 mg/dL (ref 65–99)
Potassium: 4.2 mmol/L (ref 3.5–5.2)
Sodium: 140 mmol/L (ref 134–144)
eGFR: 64 mL/min/{1.73_m2} (ref >59)

## 2021-02-04 LAB — TSH+FREE T4
Free T4: 1.05 ng/dL (ref 0.82–1.77)
TSH: 1.99 u[IU]/mL (ref 0.450–4.500)

## 2021-02-04 LAB — HEMOGLOBIN A1C
Est. average glucose Bld gHb Est-mCnc: 120 mg/dL
Hgb A1c MFr Bld: 5.8 % — ABNORMAL HIGH (ref 4.8–5.6)

## 2021-03-17 ENCOUNTER — Ambulatory Visit: Payer: Medicare Other | Admitting: Internal Medicine

## 2021-03-17 NOTE — Progress Notes (Deleted)
Date:  03/17/2021   Name:  Roberta Ward   DOB:  04-07-1956   MRN:  [id number]   Chief Complaint: No chief complaint on file.  Depression        This is a new problem.  The current episode started more than 1 month ago.   The problem has been gradually improving since onset.  Past treatments include SSRIs - Selective serotonin reuptake inhibitors.  Compliance with treatment is good.  Lab Results  Component Value Date   CREATININE 0.98 02/03/2021   BUN 26 02/03/2021   NA 140 02/03/2021   K 4.2 02/03/2021   CL 102 02/03/2021   CO2 23 02/03/2021   Lab Results  Component Value Date   CHOL 203 (H) 07/05/2020   HDL 55 07/05/2020   LDLCALC 124 (H) 07/05/2020   TRIG 133 07/05/2020   CHOLHDL 3.7 07/05/2020   Lab Results  Component Value Date   TSH 1.990 02/03/2021   Lab Results  Component Value Date   HGBA1C 5.8 (H) 02/03/2021   Lab Results  Component Value Date   WBC 12.2 (H) 02/03/2021   HGB 13.9 02/03/2021   HCT 41.2 02/03/2021   MCV 95 02/03/2021   PLT 275 02/03/2021   Lab Results  Component Value Date   ALT 39 (H) 07/05/2020   AST 30 07/05/2020   ALKPHOS 67 07/05/2020   BILITOT 0.4 07/05/2020     Review of Systems  Psychiatric/Behavioral:  Positive for depression.    Patient Active Problem List   Diagnosis Date Noted   Tremor 02/03/2021   Mood disorder (HCC) 02/03/2021   Pain, joint, multiple sites 02/03/2021   Gastroesophageal reflux disease 09/20/2020   Mild hyperlipidemia 07/06/2020   Prediabetes 04/01/2020   Closed compression fracture of L2 lumbar vertebra, initial encounter (HCC) 03/02/2020   Age-related osteoporosis with current pathological fracture 03/01/2020   Primary insomnia 03/01/2020   Left foot pain 03/01/2020   Primary osteoarthritis of left knee 03/01/2020   Lumbar back pain 03/01/2020   Patient is Jehovah's Witness 03/01/2020   Colon polyp 08/14/2016   Vitamin D deficiency 01/25/2016    Allergies  Allergen Reactions    Sulfa Antibiotics Hives and Itching   Latex     Past Surgical History:  Procedure Laterality Date   ABDOMINAL HYSTERECTOMY     KNEE CARTILAGE SURGERY Left    PARTIAL HYSTERECTOMY  1980   Rt ovary still present and cervix still present.   TONSILECTOMY/ADENOIDECTOMY WITH MYRINGOTOMY      Social History   Tobacco Use   Smoking status: Never   Smokeless tobacco: Never  Vaping Use   Vaping Use: Never used  Substance Use Topics   Alcohol use: Yes    Comment: rare- occasional   Drug use: Never     Medication list has been reviewed and updated.  No outpatient medications have been marked as taking for the 03/17/21 encounter (Appointment) with Reubin Milan, MD.    [facility] 2/9 Scores 02/03/2021 07/05/2020 05/11/2020 03/01/2020  PHQ - 2 Score 4 0 0 0  PHQ- 9 Score 11 0 - 6    GAD 7 : Generalized Anxiety Score 02/03/2021 07/05/2020 03/01/2020  Nervous, Anxious, on Edge 3 0 0  Control/stop worrying 2 0 0  Worry too much - different things 2 0 0  Trouble relaxing 2 0 2  Restless 0 0 3  Easily annoyed or irritable 0 0 0  Afraid - awful might happen 0 0 0  Total  GAD 7 Score 9 0 5  Anxiety Difficulty Not difficult at all - Not difficult at all    BP Readings from Last 3 Encounters:  02/03/21 (!) 142/64  09/20/20 134/76  07/05/20 132/82    Physical Exam  Wt Readings from Last 3 Encounters:  02/03/21 140 lb (63.5 kg)  09/20/20 143 lb (64.9 kg)  07/05/20 144 lb (65.3 kg)    There were no vitals taken for this visit.  Assessment and Plan:

## 2021-05-15 ENCOUNTER — Ambulatory Visit (INDEPENDENT_AMBULATORY_CARE_PROVIDER_SITE_OTHER): Payer: Medicare Other

## 2021-05-15 ENCOUNTER — Other Ambulatory Visit: Payer: Self-pay

## 2021-05-15 VITALS — BP 122/82 | HR 69 | Temp 98.0°F | Resp 16 | Ht 62.0 in | Wt 140.8 lb

## 2021-05-15 DIAGNOSIS — Z0100 Encounter for examination of eyes and vision without abnormal findings: Secondary | ICD-10-CM

## 2021-05-15 DIAGNOSIS — Z0000 Encounter for general adult medical examination without abnormal findings: Secondary | ICD-10-CM

## 2021-05-15 NOTE — Progress Notes (Signed)
Subjective:   Roberta Ward is a 65 y.o. female who presents for Medicare Annual (Subsequent) preventive examination.  Review of Systems     Cardiac Risk Factors include: advanced age (>34men, >44 women)     Objective:    Today's Vitals   05/15/21 0826  BP: 122/82  Pulse: 69  Resp: 16  Temp: 98 F (36.7 C)  TempSrc: Oral  SpO2: 99%  Weight: 140 lb 12.8 oz (63.9 kg)  Height: 5\' 2"  (1.575 m)  PainSc: 8    Body mass index is 25.75 kg/m.  Advanced Directives 05/15/2021  Does Patient Have a Medical Advance Directive? No  Would patient like information on creating a medical advance directive? Yes (MAU/Ambulatory/Procedural Areas - Information given)    Current Medications (verified) Outpatient Encounter Medications as of 05/15/2021  Medication Sig   Ascorbic Acid (VITAMIN C) 1000 MG tablet Take 1,000 mg by mouth daily.   Cholecalciferol (VITAMIN D) 50 MCG (2000 UT) CAPS Take by mouth.   denosumab (PROLIA) 60 MG/ML SOSY injection Inject into the skin. q6 months   magnesium oxide (MAG-OX) 400 MG tablet Take by mouth.   Melatonin 10 MG CAPS Take by mouth.   omeprazole (PRILOSEC) 20 MG capsule Take 1 capsule (20 mg total) by mouth daily.   [DISCONTINUED] escitalopram (LEXAPRO) 10 MG tablet Take 1 tablet (10 mg total) by mouth daily.   [DISCONTINUED] naproxen (NAPROSYN) 500 MG tablet Take 500 mg by mouth 2 (two) times daily as needed.   No facility-administered encounter medications on file as of 05/15/2021.    Allergies (verified) Sulfa antibiotics, Latex, and Talc   History: History reviewed. No pertinent past medical history. Past Surgical History:  Procedure Laterality Date   ABDOMINAL HYSTERECTOMY     KNEE CARTILAGE SURGERY Left    PARTIAL HYSTERECTOMY  1980   Rt ovary still present and cervix still present.   TONSILECTOMY/ADENOIDECTOMY WITH MYRINGOTOMY     Family History  Problem Relation Age of Onset   Breast cancer Mother    Breast cancer Maternal  Aunt    Social History   Socioeconomic History   Marital status: Married    Spouse name: Orlando    Number of children: 1   Years of education: Not on file   Highest education level: Not on file  Occupational History   Not on file  Tobacco Use   Smoking status: Never   Smokeless tobacco: Never  Vaping Use   Vaping Use: Never used  Substance and Sexual Activity   Alcohol use: Yes    Comment: rare- occasional   Drug use: Never   Sexual activity: Not Currently  Other Topics Concern   Not on file  Social History Narrative   Not on file   Social Determinants of Health   Financial Resource Strain: Low Risk    Difficulty of Paying Living Expenses: Not hard at all  Food Insecurity: No Food Insecurity   Worried About 05/17/2021 in the Last Year: Never true   Ran Out of Food in the Last Year: Never true  Transportation Needs: No Transportation Needs   Lack of Transportation (Medical): No   Lack of Transportation (Non-Medical): No  Physical Activity: Inactive   Days of Exercise per Week: 0 days   Minutes of Exercise per Session: 0 min  Stress: No Stress Concern Present   Feeling of Stress : Only a little  Social Connections: Moderately Integrated   Frequency of Communication with Friends and Family: More  than three times a week   Frequency of Social Gatherings with Friends and Family: More than three times a week   Attends Religious Services: More than 4 times per year   Active Member of [employer] or Organizations: No   Attends Engineer, structural: Never   Marital Status: Married    Tobacco Counseling Counseling given: Not Answered   Clinical Intake:  Pre-visit preparation completed: Yes  Pain : 0-10 Pain Score: 8  Pain Type: Chronic pain Pain Location: Shoulder Pain Orientation: Right Pain Descriptors / Indicators: Aching, Sore (arthritis) Pain Onset: More than a month ago Pain Frequency: Constant     BMI - recorded: 25.75 Nutritional  Status: BMI 25 -29 Overweight Nutritional Risks: None Diabetes: No  How often do you need to have someone help you when you read instructions, pamphlets, or other written materials from your doctor or pharmacy?: 1 - Never    Interpreter Needed?: No  Information entered by :: [name] LPN   Activities of Daily Living In your present state of health, do you have any difficulty performing the following activities: 05/15/2021 02/03/2021  Hearing? N N  Vision? N N  Difficulty concentrating or making decisions? N Y  Walking or climbing stairs? N Y  Dressing or bathing? N N  Doing errands, shopping? N Y  Quarry manager and eating ? N -  Using the Toilet? N -  In the past six months, have you accidently leaked urine? N -  Do you have problems with loss of bowel control? N -  Managing your Medications? N -  Managing your Finances? N -  Housekeeping or managing your Housekeeping? N -  Some recent data might be hidden    Patient Care Team: Reubin Milan, MD as PCP - General (Internal Medicine)  Indicate any recent Medical Services you may have received from other than [facility] providers in the past year (date may be approximate).     Assessment:   This is a routine wellness examination for [place].  Hearing/Vision screen Hearing Screening - Comments:: Pt denies hearing difficulty Vision Screening - Comments:: Pt new to area plans to establish care with new provider; referral sent today  Dietary issues and exercise activities discussed: Current Exercise Habits: The patient does not participate in regular exercise at present, Exercise limited by: orthopedic condition(s)   Goals Addressed             This Visit's Progress    DIET - INCREASE WATER INTAKE       Recommend drinking 6-8 glasses of water per day        Depression Screen PHQ 2/9 Scores 05/15/2021 02/03/2021 07/05/2020 05/11/2020 03/01/2020  PHQ - 2 Score 0 4 0 0 0  PHQ- 9 Score 6 11 0 - 6    Fall Risk Fall  Risk  05/15/2021 02/03/2021 07/05/2020 05/11/2020 03/01/2020  Falls in the past year? 0 0 0 0 1  Number falls in past yr: 0 0 - 0 0  Injury with Fall? 0 0 - 0 1  Risk for fall due to : No Fall Risks No Fall Risks - No Fall Risks History of fall(s)  Follow up Falls prevention discussed Falls evaluation completed Falls evaluation completed Falls prevention discussed Falls evaluation completed    FALL RISK PREVENTION PERTAINING TO THE HOME:  Any stairs in or around the home? Yes If so, are there any without handrails? No  Home free of loose throw rugs in walkways, pet beds, electrical  cords, etc? Yes  Adequate lighting in your home to reduce risk of falls? Yes   ASSISTIVE DEVICES UTILIZED TO PREVENT FALLS:  Life alert? No  Use of a cane, walker or w/c? No  Grab bars in the bathroom? No  Shower chair or bench in shower? No  Elevated toilet seat or a handicapped toilet? No   TIMED UP AND GO:  Was the test performed? Yes .  Length of time to ambulate 10 feet: 6 sec.   Gait steady and fast without use of assistive device  Cognitive Function: Normal cognitive status assessed by direct observation by this Nurse Health Advisor. No abnormalities found.          Immunizations Immunization History  Administered Date(s) Administered   Fluad Quad(high Dose 65+) 07/05/2020   Influenza, High Dose Seasonal PF 04/23/2021   PFIZER(Purple Top)SARS-COV-2 Vaccination 09/21/2019, 10/11/2019, 04/30/2020, 11/01/2020   Pfizer Covid-19 Vaccine Bivalent Booster 73yrs & up 04/23/2021   Pneumococcal Conjugate-13 09/20/2020   Pneumococcal Polysaccharide-23 02/28/2019    TDAP status: Due, Education has been provided regarding the importance of this vaccine. Advised may receive this vaccine at local pharmacy or Health Dept. Aware to provide a copy of the vaccination record if obtained from local pharmacy or Health Dept. Verbalized acceptance and understanding.  Flu Vaccine status: Up to  date  Pneumococcal vaccine status: Up to date  Covid-19 vaccine status: Completed vaccines  Qualifies for Shingles Vaccine? Yes   Zostavax completed No   Shingrix completed: yes per patient, need records  Screening Tests Health Maintenance  Topic Date Due   TETANUS/TDAP  Never done   Zoster Vaccines- Shingrix (1 of 2) Never done   MAMMOGRAM  09/12/2021   Pneumonia Vaccine 9+ Years old (3) 02/28/2024   COLONOSCOPY (Pts 45-2yrs Insurance coverage will need to be confirmed)  11/23/2026   INFLUENZA VACCINE  Completed   DEXA SCAN  Completed   COVID-19 Vaccine  Completed   Hepatitis C Screening  Completed   HPV VACCINES  Aged Out    Health Maintenance  Health Maintenance Due  Topic Date Due   TETANUS/TDAP  Never done   Zoster Vaccines- Shingrix (1 of 2) Never done    Colorectal cancer screening: Type of screening: Colonoscopy. Completed 11/22/16. Repeat every 10 years  Mammogram status: Completed 09/12/20. Repeat every year  Bone Density status: Completed 09/12/20. Results reflect: Bone density results: OSTEOPOROSIS. Repeat every 2 years.  Lung Cancer Screening: (Low Dose CT Chest recommended if Age 105-80 years, 30 pack-year currently smoking OR have quit w/in 15years.) does not qualify.   Additional Screening:  Hepatitis C Screening: does qualify; Completed 07/05/20  Vision Screening: Recommended annual ophthalmology exams for early detection of glaucoma and other disorders of the eye. Is the patient up to date with their annual eye exam?  No Who is the provider or what is the name of the office in which the patient attends annual eye exams? Not established If pt is not established with a provider, would they like to be referred to a provider to establish care? Yes .   Dental Screening: Recommended annual dental exams for proper oral hygiene  Community Resource Referral / Chronic Care Management: CRR required this visit?  No   CCM required this visit?  No       Plan:     I have personally reviewed and noted the following in the patient's chart:   Medical and social history Use of alcohol, tobacco or illicit drugs  Current  medications and supplements including opioid prescriptions.  Functional ability and status Nutritional status Physical activity Advanced directives List of other physicians Hospitalizations, surgeries, and ER visits in previous 12 months Vitals Screenings to include cognitive, depression, and falls Referrals and appointments  In addition, I have reviewed and discussed with patient certain preventive protocols, quality metrics, and best practice recommendations. A written personalized care plan for preventive services as well as general preventive health recommendations were provided to patient.     [name], LPN   [id number]   Nurse Notes: pt c/o arthritis pain in right shoulder 8/10. She is currently seeing [facility] and received a cortisone injection but states it did not work for her. She has also tried naproxen and celebrex which she is no longer taking because they were not helpful. Pt advised to follow up with [facility].   Pt c/o ongoing vaginal discharge which she has been treated by Dr. Judithann Graves for previously. Pt scheduled for new Gyn appt 06/19/21  Pt states she has upcoming appt with Dr. Allena Katz for rheumatology in December but she is requesting a new referral for a different rheumatologist for second opinion.

## 2021-05-15 NOTE — Patient Instructions (Signed)
Roberta Ward , Thank you for taking time to come for your Medicare Wellness Visit. I appreciate your ongoing commitment to your health goals. Please review the following plan we discussed and let me know if I can assist you in the future.   Screening recommendations/referrals: Colonoscopy: done 11/22/16; repeat 11/2026 Mammogram: done 09/12/20 Bone Density: done 09/12/20 Recommended yearly ophthalmology/optometry visit for glaucoma screening and checkup Recommended yearly dental visit for hygiene and checkup  Vaccinations: Influenza vaccine: done 04/23/21 Pneumococcal vaccine: done 09/20/20 Tdap vaccine: due Shingles vaccine: please bring a record of your shingles vaccine   Covid-19:done 09/21/19, 10/11/19, 04/30/20, 11/01/20 & 04/23/21  Advanced directives: Advance directive discussed with you today. I have provided a copy for you to complete at home and have notarized. Once this is complete please bring a copy in to our office so we can scan it into your chart.   Conditions/risks identified: Recommend drinking 6-8 glasses of water per day   Next appointment: Follow up in one year for your annual wellness visit    Preventive Care 65 Years and Older, Female Preventive care refers to lifestyle choices and visits with your health care provider that can promote health and wellness. What does preventive care include? A yearly physical exam. This is also called an annual well check. Dental exams once or twice a year. Routine eye exams. Ask your health care provider how often you should have your eyes checked. Personal lifestyle choices, including: Daily care of your teeth and gums. Regular physical activity. Eating a healthy diet. Avoiding tobacco and drug use. Limiting alcohol use. Practicing safe sex. Taking low-dose aspirin every day. Taking vitamin and mineral supplements as recommended by your health care provider. What happens during an annual well check? The services and screenings done  by your health care provider during your annual well check will depend on your age, overall health, lifestyle risk factors, and family history of disease. Counseling  Your health care provider may ask you questions about your: Alcohol use. Tobacco use. Drug use. Emotional well-being. Home and relationship well-being. Sexual activity. Eating habits. History of falls. Memory and ability to understand (cognition). Work and work Astronomer. Reproductive health. Screening  You may have the following tests or measurements: Height, weight, and BMI. Blood pressure. Lipid and cholesterol levels. These may be checked every 5 years, or more frequently if you are over 49 years old. Skin check. Lung cancer screening. You may have this screening every year starting at age 19 if you have a 30-pack-year history of smoking and currently smoke or have quit within the past 15 years. Fecal occult blood test (FOBT) of the stool. You may have this test every year starting at age 27. Flexible sigmoidoscopy or colonoscopy. You may have a sigmoidoscopy every 5 years or a colonoscopy every 10 years starting at age 64. Hepatitis C blood test. Hepatitis B blood test. Sexually transmitted disease (STD) testing. Diabetes screening. This is done by checking your blood sugar (glucose) after you have not eaten for a while (fasting). You may have this done every 1-3 years. Bone density scan. This is done to screen for osteoporosis. You may have this done starting at age 18. Mammogram. This may be done every 1-2 years. Talk to your health care provider about how often you should have regular mammograms. Talk with your health care provider about your test results, treatment options, and if necessary, the need for more tests. Vaccines  Your health care provider may recommend certain vaccines, such as:  Influenza vaccine. This is recommended every year. Tetanus, diphtheria, and acellular pertussis (Tdap, Td) vaccine. You  may need a Td booster every 10 years. Zoster vaccine. You may need this after age 2. Pneumococcal 13-valent conjugate (PCV13) vaccine. One dose is recommended after age 65. Pneumococcal polysaccharide (PPSV23) vaccine. One dose is recommended after age 28. Talk to your health care provider about which screenings and vaccines you need and how often you need them. This information is not intended to replace advice given to you by your health care provider. Make sure you discuss any questions you have with your health care provider. Document Released: 08/05/2015 Document Revised: 03/28/2016 Document Reviewed: 05/10/2015 Elsevier Interactive Patient Education  2017 [place] Prevention in the Home Falls can cause injuries. They can happen to people of all ages. There are many things you can do to make your home safe and to help prevent falls. What can I do on the outside of my home? Regularly fix the edges of walkways and driveways and fix any cracks. Remove anything that might make you trip as you walk through a door, such as a raised step or threshold. Trim any bushes or trees on the path to your home. Use bright outdoor lighting. Clear any walking paths of anything that might make someone trip, such as rocks or tools. Regularly check to see if handrails are loose or broken. Make sure that both sides of any steps have handrails. Any raised decks and porches should have guardrails on the edges. Have any leaves, snow, or ice cleared regularly. Use sand or salt on walking paths during winter. Clean up any spills in your garage right away. This includes oil or grease spills. What can I do in the bathroom? Use night lights. Install grab bars by the toilet and in the tub and shower. Do not use towel bars as grab bars. Use non-skid mats or decals in the tub or shower. If you need to sit down in the shower, use a plastic, non-slip stool. Keep the floor dry. Clean up any water that spills  on the floor as soon as it happens. Remove soap buildup in the tub or shower regularly. Attach bath mats securely with double-sided non-slip rug tape. Do not have throw rugs and other things on the floor that can make you trip. What can I do in the bedroom? Use night lights. Make sure that you have a light by your bed that is easy to reach. Do not use any sheets or blankets that are too big for your bed. They should not hang down onto the floor. Have a firm chair that has side arms. You can use this for support while you get dressed. Do not have throw rugs and other things on the floor that can make you trip. What can I do in the kitchen? Clean up any spills right away. Avoid walking on wet floors. Keep items that you use a lot in easy-to-reach places. If you need to reach something above you, use a strong step stool that has a grab bar. Keep electrical cords out of the way. Do not use floor polish or wax that makes floors slippery. If you must use wax, use non-skid floor wax. Do not have throw rugs and other things on the floor that can make you trip. What can I do with my stairs? Do not leave any items on the stairs. Make sure that there are handrails on both sides of the stairs and use them.  Fix handrails that are broken or loose. Make sure that handrails are as long as the stairways. Check any carpeting to make sure that it is firmly attached to the stairs. Fix any carpet that is loose or worn. Avoid having throw rugs at the top or bottom of the stairs. If you do have throw rugs, attach them to the floor with carpet tape. Make sure that you have a light switch at the top of the stairs and the bottom of the stairs. If you do not have them, ask someone to add them for you. What else can I do to help prevent falls? Wear shoes that: Do not have high heels. Have rubber bottoms. Are comfortable and fit you well. Are closed at the toe. Do not wear sandals. If you use a stepladder: Make  sure that it is fully opened. Do not climb a closed stepladder. Make sure that both sides of the stepladder are locked into place. Ask someone to hold it for you, if possible. Clearly mark and make sure that you can see: Any grab bars or handrails. First and last steps. Where the edge of each step is. Use tools that help you move around (mobility aids) if they are needed. These include: Canes. Walkers. Scooters. Crutches. Turn on the lights when you go into a dark area. Replace any light bulbs as soon as they burn out. Set up your furniture so you have a clear path. Avoid moving your furniture around. If any of your floors are uneven, fix them. If there are any pets around you, be aware of where they are. Review your medicines with your doctor. Some medicines can make you feel dizzy. This can increase your chance of falling. Ask your doctor what other things that you can do to help prevent falls. This information is not intended to replace advice given to you by your health care provider. Make sure you discuss any questions you have with your health care provider. Document Released: 05/05/2009 Document Revised: 12/15/2015 Document Reviewed: 08/13/2014 Elsevier Interactive Patient Education  2017 [employer].

## 2021-05-31 ENCOUNTER — Telehealth: Payer: Self-pay

## 2021-05-31 NOTE — Telephone Encounter (Signed)
Copied from CRM [id number]. Topic: Referral - Request for Referral >> May 31, 2021 11:35 AM Louie Bun, Rosey Bath D wrote: Has patient seen PCP for this complaint? No. *If NO, is insurance requiring patient see PCP for this issue before PCP can refer them? Referral for which specialty: Rheumatology Preferred provider/office: Verne Carrow with [facility], phone: [phone] Reason for referral: body pain, joint pain

## 2021-06-02 ENCOUNTER — Ambulatory Visit (INDEPENDENT_AMBULATORY_CARE_PROVIDER_SITE_OTHER): Payer: Medicare Other | Admitting: Internal Medicine

## 2021-06-02 ENCOUNTER — Encounter: Payer: Self-pay | Admitting: Internal Medicine

## 2021-06-02 ENCOUNTER — Other Ambulatory Visit: Payer: Self-pay

## 2021-06-02 VITALS — BP 128/78 | HR 74 | Ht 62.0 in | Wt 141.6 lb

## 2021-06-02 DIAGNOSIS — K219 Gastro-esophageal reflux disease without esophagitis: Secondary | ICD-10-CM | POA: Diagnosis not present

## 2021-06-02 DIAGNOSIS — G2581 Restless legs syndrome: Secondary | ICD-10-CM

## 2021-06-02 DIAGNOSIS — M778 Other enthesopathies, not elsewhere classified: Secondary | ICD-10-CM | POA: Diagnosis not present

## 2021-06-02 DIAGNOSIS — M1712 Unilateral primary osteoarthritis, left knee: Secondary | ICD-10-CM

## 2021-06-02 MED ORDER — OMEPRAZOLE 20 MG PO CPDR: 20.0000 mg | DELAYED_RELEASE_CAPSULE | Freq: Every day | ORAL | 1 refills | Status: DC

## 2021-06-02 MED ORDER — ROPINIROLE HCL 0.25 MG PO TABS: 0.2500 mg | ORAL_TABLET | Freq: Every day | ORAL | 0 refills | Status: DC

## 2021-06-02 NOTE — Patient Instructions (Signed)
Good quality fish oil with EPA/DHA  Limit or cut out refined sugars from the diet  Take Requip 2-3 hours before bedtime for restless legs

## 2021-06-02 NOTE — Progress Notes (Signed)
Date:  06/02/2021   Name:  Roberta Ward   DOB:  25-Mar-1956   MRN:  [id number]   Chief Complaint: Joint Pain  Gastroesophageal Reflux She complains of heartburn. She reports no abdominal pain, no chest pain, no coughing or no wheezing. This is a recurrent problem. The problem occurs rarely. Pertinent negatives include no anemia, fatigue or muscle weakness. She has tried a PPI for the symptoms.  Joint pain - seen by Rheumatology in August.  Labs were ordered to rule out auto-immune causes and she was started on Celebrex 100 mg bid.  RA, ANA, CCP and Sjogren's all negative. Her main joint pain is the right shoulder and left knee.  Both have been injected by Ortho. The shoulder injection did not help much and she can not get another one for several months.  Celebrex did not help her other joint pains.  The same is true about multiple other nsaids.  RLS - she has restless leg sx.  Hard to get to sleep.  Not taking anything for this.  CBC is normal but no iron studies have been done.  Lumbar spondylosis - followed by [facility].  MRI of lumbar spine done.  She is currently in Pain management through the [facility]. 07/2020: IMPRESSION: 1. Healed superior endplate fracture at L2 with loss of height of 20%. No retropulsed bone. This was probably acute in August of last year. 2. Mild non-compressive disc bulges at L1-2, L2-3, L3-4 and L4-5. 3. Facet osteoarthritis at L2-3 possibly secondary to slight alteration of biomechanics from the superior endplate L2 fracture. This could contribute to back pain. 4. Degenerative facet osteoarthritis at L4-5 and L5-S1, more pronounced at L4-5, with edematous change. The facet arthritis could be a cause of back pain or referred facet syndrome pain  OP - she has also been seen by Endocrinology. They recommended Prolia with buy and bill and that injection is planned for January.  Lab Results  Component Value Date   CREATININE 0.98 02/03/2021   BUN 26  02/03/2021   NA 140 02/03/2021   K 4.2 02/03/2021   CL 102 02/03/2021   CO2 23 02/03/2021   Lab Results  Component Value Date   CHOL 203 (H) 07/05/2020   HDL 55 07/05/2020   LDLCALC 124 (H) 07/05/2020   TRIG 133 07/05/2020   CHOLHDL 3.7 07/05/2020   Lab Results  Component Value Date   TSH 1.990 02/03/2021   Lab Results  Component Value Date   HGBA1C 5.8 (H) 02/03/2021   Lab Results  Component Value Date   WBC 12.2 (H) 02/03/2021   HGB 13.9 02/03/2021   HCT 41.2 02/03/2021   MCV 95 02/03/2021   PLT 275 02/03/2021   Lab Results  Component Value Date   ALT 39 (H) 07/05/2020   AST 30 07/05/2020   ALKPHOS 67 07/05/2020   BILITOT 0.4 07/05/2020     Review of Systems  Constitutional:  Negative for fatigue and unexpected weight change.  HENT:  Negative for nosebleeds.   Eyes:  Negative for visual disturbance.  Respiratory:  Negative for cough, chest tightness, shortness of breath and wheezing.   Cardiovascular:  Negative for chest pain, palpitations and leg swelling.  Gastrointestinal:  Positive for heartburn. Negative for abdominal pain, constipation and diarrhea.  Musculoskeletal:  Positive for arthralgias and back pain. Negative for myalgias and muscle weakness.  Neurological:  Negative for dizziness, weakness, light-headedness and headaches.  Psychiatric/Behavioral:  Positive for sleep disturbance. Negative for dysphoric mood.  The patient is not nervous/anxious.    Patient Active Problem List   Diagnosis Date Noted   Tremor 02/03/2021   Episode of moderate major depression ([place]) 02/03/2021   Pain, joint, multiple sites 02/03/2021   Gastroesophageal reflux disease 09/20/2020   Mild hyperlipidemia 07/06/2020   Prediabetes 04/01/2020   Closed compression fracture of L2 lumbar vertebra, initial encounter ([place]) 03/02/2020   Age-related osteoporosis with current pathological fracture 03/01/2020   Primary insomnia 03/01/2020   Left foot pain 03/01/2020   Primary  osteoarthritis of left knee 03/01/2020   Lumbar back pain 03/01/2020   Patient is Jehovah's Witness 03/01/2020   Colon polyp 08/14/2016   Vitamin D deficiency 01/25/2016    Allergies  Allergen Reactions   Sulfa Antibiotics Hives and Itching   Latex    Talc     Other reaction(s): Angioedema    Past Surgical History:  Procedure Laterality Date   ABDOMINAL HYSTERECTOMY     KNEE CARTILAGE SURGERY Left    PARTIAL HYSTERECTOMY  1980   Rt ovary still present and cervix still present.   TONSILECTOMY/ADENOIDECTOMY WITH MYRINGOTOMY      Social History   Tobacco Use   Smoking status: Never   Smokeless tobacco: Never  Vaping Use   Vaping Use: Never used  Substance Use Topics   Alcohol use: Yes    Comment: rare- occasional   Drug use: Never     Medication list has been reviewed and updated.  Current Meds  Medication Sig   Ascorbic Acid (VITAMIN C) 1000 MG tablet Take 1,000 mg by mouth daily.   Cholecalciferol (VITAMIN D) 50 MCG (2000 UT) CAPS Take by mouth.   denosumab (PROLIA) 60 MG/ML SOSY injection Inject into the skin. q6 months   magnesium oxide (MAG-OX) 400 MG tablet Take by mouth.   Melatonin 10 MG CAPS Take by mouth.   omeprazole (PRILOSEC) 20 MG capsule Take 1 capsule (20 mg total) by mouth daily.    PHQ 2/9 Scores 05/15/2021 02/03/2021 07/05/2020 05/11/2020  PHQ - 2 Score 0 4 0 0  PHQ- 9 Score 6 11 0 -    GAD 7 : Generalized Anxiety Score 02/03/2021 07/05/2020 03/01/2020  Nervous, Anxious, on Edge 3 0 0  Control/stop worrying 2 0 0  Worry too much - different things 2 0 0  Trouble relaxing 2 0 2  Restless 0 0 3  Easily annoyed or irritable 0 0 0  Afraid - awful might happen 0 0 0  Total GAD 7 Score 9 0 5  Anxiety Difficulty Not difficult at all - Not difficult at all    BP Readings from Last 3 Encounters:  06/02/21 128/78  05/15/21 122/82  02/03/21 (!) 142/64    Physical Exam Vitals and nursing note reviewed.  Constitutional:      General: She is  not in acute distress.    Appearance: Normal appearance. She is well-developed.  HENT:     Head: Normocephalic and atraumatic.  Cardiovascular:     Rate and Rhythm: Normal rate and regular rhythm.     Pulses: Normal pulses.  Pulmonary:     Effort: Pulmonary effort is normal. No respiratory distress.     Breath sounds: No wheezing or rhonchi.  Musculoskeletal:     Cervical back: Normal range of motion.     Right lower leg: No edema.     Left lower leg: No edema.  Skin:    General: Skin is warm and dry.     Capillary  Refill: Capillary refill takes less than 2 seconds.     Findings: No rash.  Neurological:     General: No focal deficit present.     Mental Status: She is alert and oriented to person, place, and time.  Psychiatric:        Mood and Affect: Mood normal.        Behavior: Behavior normal.    Wt Readings from Last 3 Encounters:  06/02/21 141 lb 9.6 oz (64.2 kg)  05/15/21 140 lb 12.8 oz (63.9 kg)  02/03/21 140 lb (63.5 kg)    BP 128/78   Pulse 74   Ht 5\' 2"  (1.575 m)   Wt 141 lb 9.6 oz (64.2 kg)   SpO2 98%   BMI 25.90 kg/m   Assessment and Plan: 1. Restless legs syndrome (RLS) Rule out iron deficiency. Trial of Requip - slowly titrate upward for benefit - Iron, TIBC and Ferritin Panel - CBC with Differential/Platelet - rOPINIRole (REQUIP) 0.25 MG tablet; Take 1 tablet (0.25 mg total) by mouth at bedtime.  Dispense: 30 tablet; Refill: 0  2. Gastroesophageal reflux disease, unspecified whether esophagitis present Symptoms well controlled on daily PPI No red flag signs such as weight loss, n/v, melena Will continue omeprazole. - omeprazole (PRILOSEC) 20 MG capsule; Take 1 capsule (20 mg total) by mouth daily.  Dispense: 60 capsule; Refill: 1  3. Primary osteoarthritis of left knee Recommend Ortho follow up. Continue to search for nsaids that provide benefit Consider high quality Fish oil supplement and avoiding refined sugar  4. Shoulder tendonitis,  right Did not respond to steroid injection - she may need to have a second one before giving up. We discussed the role of Rheumatology and the diseases they treat.  Since her screening labs were negative, the chance of an auto-immune condition causing her joint complaints is very low.    Partially dictated using . Any errors are unintentional.  Animal nutritionist, MD [facility]  06/02/2021

## 2021-06-03 LAB — CBC WITH DIFFERENTIAL/PLATELET
Basophils Absolute: 0 10*3/uL (ref 0.0–0.2)
Basos: 0 %
EOS (ABSOLUTE): 0.2 10*3/uL (ref 0.0–0.4)
Eos: 4 %
Hematocrit: 45.4 % (ref 34.0–46.6)
Hemoglobin: 15.2 g/dL (ref 11.1–15.9)
Immature Grans (Abs): 0 10*3/uL (ref 0.0–0.1)
Immature Granulocytes: 0 %
Lymphocytes Absolute: 1.7 10*3/uL (ref 0.7–3.1)
Lymphs: 27 %
MCH: 32.2 pg (ref 26.6–33.0)
MCHC: 33.5 g/dL (ref 31.5–35.7)
MCV: 96 fL (ref 79–97)
Monocytes Absolute: 0.6 10*3/uL (ref 0.1–0.9)
Monocytes: 9 %
Neutrophils Absolute: 3.8 10*3/uL (ref 1.4–7.0)
Neutrophils: 60 %
Platelets: 287 10*3/uL (ref 150–450)
RBC: 4.72 x10E6/uL (ref 3.77–5.28)
RDW: 12.5 % (ref 11.7–15.4)
WBC: 6.3 10*3/uL (ref 3.4–10.8)

## 2021-06-03 LAB — IRON,TIBC AND FERRITIN PANEL
Ferritin: 134 ng/mL (ref 15–150)
Iron Saturation: 20 % (ref 15–55)
Iron: 81 ug/dL (ref 27–139)
Total Iron Binding Capacity: 413 ug/dL (ref 250–450)
UIBC: 332 ug/dL (ref 118–369)

## 2021-06-19 ENCOUNTER — Encounter: Payer: Self-pay | Admitting: Obstetrics and Gynecology

## 2021-06-19 ENCOUNTER — Other Ambulatory Visit (HOSPITAL_COMMUNITY)
Admission: RE | Admit: 2021-06-19 | Discharge: 2021-06-19 | Disposition: A | Discharge status: Home / Self Care | Payer: Medicare Other | Source: Ambulatory Visit | Attending: Obstetrics and Gynecology | Admitting: Obstetrics and Gynecology

## 2021-06-19 ENCOUNTER — Other Ambulatory Visit: Payer: Self-pay

## 2021-06-19 ENCOUNTER — Ambulatory Visit (INDEPENDENT_AMBULATORY_CARE_PROVIDER_SITE_OTHER): Payer: Medicare Other | Admitting: Obstetrics and Gynecology

## 2021-06-19 DIAGNOSIS — N761 Subacute and chronic vaginitis: Secondary | ICD-10-CM | POA: Insufficient documentation

## 2021-06-19 DIAGNOSIS — Z01419 Encounter for gynecological examination (general) (routine) without abnormal findings: Secondary | ICD-10-CM

## 2021-06-19 NOTE — Progress Notes (Signed)
Subjective:     Roberta Ward is a 65 y.o. female and is here for a comprehensive physical exam. The patient reports presence of a vaginal discharge with odor. Patient reports being treated twice for BV in March and July 2022. The vaginal discharge seems to have returned. Patient is not sexually active. She denies urinary incontinence. Patient admits to a vaginal odor at times but denies any vaginal pruritis. Patient is without any other complaints. Patient had a hysterectomy following her vaginal delivery due to [facility].  No past medical history on file. Past Surgical History:  Procedure Laterality Date   ABDOMINAL HYSTERECTOMY     KNEE CARTILAGE SURGERY Left    PARTIAL HYSTERECTOMY  1980   Rt ovary still present and cervix still present.   TONSILECTOMY/ADENOIDECTOMY WITH MYRINGOTOMY     Family History  Problem Relation Age of Onset   Breast cancer Mother    Breast cancer Maternal Aunt     Social History   Socioeconomic History   Marital status: Married    Spouse name: [place]    Number of children: 1   Years of education: Not on file   Highest education level: Not on file  Occupational History   Not on file  Tobacco Use   Smoking status: Never   Smokeless tobacco: Never  Vaping Use   Vaping Use: Never used  Substance and Sexual Activity   Alcohol use: Yes    Comment: rare- occasional   Drug use: Never   Sexual activity: Not Currently  Other Topics Concern   Not on file  Social History Narrative   Not on file   Social Determinants of Health   Financial Resource Strain: Low Risk    Difficulty of Paying Living Expenses: Not hard at all  Food Insecurity: No Food Insecurity   Worried About Programme researcher, broadcasting/film/video in the Last Year: Never true   Ran Out of Food in the Last Year: Never true  Transportation Needs: No Transportation Needs   Lack of Transportation (Medical): No   Lack of Transportation (Non-Medical): No  Physical Activity: Inactive   Days of Exercise per  Week: 0 days   Minutes of Exercise per Session: 0 min  Stress: No Stress Concern Present   Feeling of Stress : Only a little  Social Connections: Moderately Integrated   Frequency of Communication with Friends and Family: More than three times a week   Frequency of Social Gatherings with Friends and Family: More than three times a week   Attends Religious Services: More than 4 times per year   Active Member of [employer] or Organizations: No   Attends Engineer, structural: Never   Marital Status: Married  Catering manager Violence: Not At Risk   Fear of Current or Ex-Partner: No   Emotionally Abused: No   Physically Abused: No   Sexually Abused: No   Health Maintenance  Topic Date Due   TETANUS/TDAP  Never done   Zoster Vaccines- Shingrix (1 of 2) Never done   MAMMOGRAM  09/12/2021   Pneumonia Vaccine 77+ Years old (3) 02/28/2024   COLONOSCOPY (Pts 45-51yrs Insurance coverage will need to be confirmed)  11/23/2026   INFLUENZA VACCINE  Completed   DEXA SCAN  Completed   COVID-19 Vaccine  Completed   Hepatitis C Screening  Completed   HPV VACCINES  Aged Out       Review of Systems Pertinent items noted in HPI and remainder of comprehensive ROS otherwise negative.  Objective:    GENERAL: Well-developed, well-nourished female in no acute distress.  HEENT: Normocephalic, atraumatic. Sclerae anicteric.  NECK: Supple. Normal thyroid.  LUNGS: Clear to auscultation bilaterally.  HEART: Regular rate and rhythm. BREASTS: Symmetric in size. No palpable masses or lymphadenopathy, skin changes, or nipple drainage. ABDOMEN: Soft, nontender, nondistended. No organomegaly. PELVIC: Normal external female genitalia. Vagina is pink and rugated.  Normal discharge. Normal appearing cervix. Uterus is normal in size. No adnexal mass or tenderness. Chaperone present during the pelvic exam EXTREMITIES: No cyanosis, clubbing, or edema, 2+ distal pulses.     Assessment:    Healthy female  exam.      Plan:     Vaginal swab collected Pap smear no longer indicated Patient with normal mammogram 08/2020 Patient up to date on dexa scan and colonoscopy See After Visit Summary for Counseling Recommendations

## 2021-06-20 LAB — CERVICOVAGINAL ANCILLARY ONLY
Bacterial Vaginitis (gardnerella): NEGATIVE
Candida Glabrata: NEGATIVE
Candida Vaginitis: NEGATIVE
Comment: NEGATIVE
Comment: NEGATIVE
Comment: NEGATIVE

## 2021-07-10 ENCOUNTER — Other Ambulatory Visit: Payer: Self-pay

## 2021-07-10 ENCOUNTER — Ambulatory Visit (INDEPENDENT_AMBULATORY_CARE_PROVIDER_SITE_OTHER): Payer: Medicare Other | Admitting: Internal Medicine

## 2021-07-10 ENCOUNTER — Encounter: Payer: Self-pay | Admitting: Internal Medicine

## 2021-07-10 VITALS — BP 134/72 | HR 77 | Ht 62.0 in | Wt 147.2 lb

## 2021-07-10 DIAGNOSIS — E785 Hyperlipidemia, unspecified: Secondary | ICD-10-CM

## 2021-07-10 DIAGNOSIS — Z1231 Encounter for screening mammogram for malignant neoplasm of breast: Secondary | ICD-10-CM

## 2021-07-10 DIAGNOSIS — K219 Gastro-esophageal reflux disease without esophagitis: Secondary | ICD-10-CM

## 2021-07-10 DIAGNOSIS — M8000XD Age-related osteoporosis with current pathological fracture, unspecified site, subsequent encounter for fracture with routine healing: Secondary | ICD-10-CM

## 2021-07-10 DIAGNOSIS — R7303 Prediabetes: Secondary | ICD-10-CM

## 2021-07-10 DIAGNOSIS — G2581 Restless legs syndrome: Secondary | ICD-10-CM

## 2021-07-10 DIAGNOSIS — E559 Vitamin D deficiency, unspecified: Secondary | ICD-10-CM

## 2021-07-10 DIAGNOSIS — M8088XA Other osteoporosis with current pathological fracture, vertebra(e), initial encounter for fracture: Secondary | ICD-10-CM

## 2021-07-10 MED ORDER — ROPINIROLE HCL 0.25 MG PO TABS: 0.2500 mg | ORAL_TABLET | Freq: Every day | ORAL | 1 refills | Status: DC

## 2021-07-10 NOTE — Progress Notes (Signed)
Date:  07/10/2021   Name:  Roberta Ward   DOB:  1956/06/13   MRN:  [id number]   Chief Complaint: Annual Exam Roberta Ward is a 65 y.o. female who presents today for her Complete Annual Exam. She feels fairly well. She reports exercising - none. She reports she is sleeping fairly well. Breast complaints -none.  Mammogram: 08/2020 DEXA: 08/2020 osteopenia of hip Pap smear: discontinued Colonoscopy: 11/2016 repeat 10 yrs.  Immunization History  Administered Date(s) Administered   Fluad Quad(high Dose 65+) 07/05/2020   Influenza, High Dose Seasonal PF 04/23/2021   PFIZER(Purple Top)SARS-COV-2 Vaccination 09/21/2019, 10/11/2019, 04/30/2020, 11/01/2020   Pfizer Covid-19 Vaccine Bivalent Booster 28yr & up 04/23/2021   Pneumococcal Conjugate-13 09/20/2020   Pneumococcal Polysaccharide-23 02/28/2019    Gastroesophageal Reflux She complains of heartburn. She reports no abdominal pain, no chest pain, no coughing or no wheezing. This is a recurrent problem. The problem occurs occasionally. Pertinent negatives include no fatigue. She has tried a PPI for the symptoms.  Diabetes She presents for her follow-up diabetic visit. Diabetes type: prediabetes. Her disease course has been stable. Pertinent negatives for hypoglycemia include no dizziness, headaches, nervousness/anxiousness or tremors. Pertinent negatives for diabetes include no chest pain, no fatigue, no polydipsia and no polyuria. Her weight is stable.  RLS - prescribed Requip last month.  CBC and iron levels are normal.  Symptoms are improved. OP - recent DEXA, now on Prolia and followed by Endo.   Lab Results  Component Value Date   NA 140 02/03/2021   K 4.2 02/03/2021   CO2 23 02/03/2021   GLUCOSE 96 02/03/2021   BUN 26 02/03/2021   CREATININE 0.98 02/03/2021   CALCIUM 10.3 02/03/2021   EGFR 64 02/03/2021   GFRNONAA 86 07/05/2020   Lab Results  Component Value Date   CHOL 203 (H) 07/05/2020   HDL 55 07/05/2020    LDLCALC 124 (H) 07/05/2020   TRIG 133 07/05/2020   CHOLHDL 3.7 07/05/2020   Lab Results  Component Value Date   TSH 1.990 02/03/2021   Lab Results  Component Value Date   HGBA1C 5.8 (H) 02/03/2021   Lab Results  Component Value Date   WBC 6.3 06/02/2021   HGB 15.2 06/02/2021   HCT 45.4 06/02/2021   MCV 96 06/02/2021   PLT 287 06/02/2021   Lab Results  Component Value Date   ALT 39 (H) 07/05/2020   AST 30 07/05/2020   ALKPHOS 67 07/05/2020   BILITOT 0.4 07/05/2020   Lab Results  Component Value Date   VD25OH 35.6 07/05/2020     Review of Systems  Constitutional:  Negative for chills, fatigue and fever.  HENT:  Negative for congestion, hearing loss, tinnitus, trouble swallowing and voice change.   Eyes:  Negative for visual disturbance.  Respiratory:  Negative for cough, chest tightness, shortness of breath and wheezing.   Cardiovascular:  Negative for chest pain, palpitations and leg swelling.  Gastrointestinal:  Positive for heartburn. Negative for abdominal pain, constipation, diarrhea and vomiting.  Endocrine: Negative for polydipsia and polyuria.  Genitourinary:  Negative for dysuria, frequency, genital sores, vaginal bleeding and vaginal discharge.  Musculoskeletal:  Negative for arthralgias, gait problem and joint swelling.  Skin:  Negative for color change and rash.  Neurological:  Negative for dizziness, tremors, light-headedness and headaches.  Hematological:  Negative for adenopathy. Does not bruise/bleed easily.  Psychiatric/Behavioral:  Negative for dysphoric mood and sleep disturbance. The patient is not nervous/anxious.    Patient Active Problem  List   Diagnosis Date Noted   Shoulder tendonitis, right 06/02/2021   Tremor 02/03/2021   Episode of moderate major depression ([place]) 02/03/2021   Pain, joint, multiple sites 02/03/2021   Gastroesophageal reflux disease 09/20/2020   Mild hyperlipidemia 07/06/2020   Prediabetes 04/01/2020   Closed compression  fracture of L2 lumbar vertebra, initial encounter ([place]) 03/02/2020   Age-related osteoporosis with current pathological fracture 03/01/2020   Primary insomnia 03/01/2020   Left foot pain 03/01/2020   Primary osteoarthritis of left knee 03/01/2020   Lumbar back pain 03/01/2020   Patient is Jehovah's Witness 03/01/2020   Colon polyp 08/14/2016   Vitamin D deficiency 01/25/2016    Allergies  Allergen Reactions   Sulfa Antibiotics Hives and Itching   Latex    Talc     Other reaction(s): Angioedema    Past Surgical History:  Procedure Laterality Date   ABDOMINAL HYSTERECTOMY     KNEE CARTILAGE SURGERY Left    PARTIAL HYSTERECTOMY  1980   Rt ovary still present and cervix still present.   TONSILECTOMY/ADENOIDECTOMY WITH MYRINGOTOMY      Social History   Tobacco Use   Smoking status: Never   Smokeless tobacco: Never  Vaping Use   Vaping Use: Never used  Substance Use Topics   Alcohol use: Yes    Comment: rare- occasional   Drug use: Never     Medication list has been reviewed and updated.  Current Meds  Medication Sig   Ascorbic Acid (VITAMIN C) 1000 MG tablet Take 1,000 mg by mouth daily.   Cholecalciferol (VITAMIN D) 50 MCG (2000 UT) CAPS Take by mouth.   denosumab (PROLIA) 60 MG/ML SOSY injection Inject into the skin. q6 months   magnesium oxide (MAG-OX) 400 MG tablet Take by mouth.   Melatonin 10 MG CAPS Take by mouth.   omeprazole (PRILOSEC) 20 MG capsule Take 1 capsule (20 mg total) by mouth daily.   rOPINIRole (REQUIP) 0.25 MG tablet Take 1 tablet (0.25 mg total) by mouth at bedtime.    PHQ 2/9 Scores 07/10/2021 05/15/2021 02/03/2021 07/05/2020  PHQ - 2 Score 0 0 4 0  PHQ- 9 Score _0 0    GAD 7 : Generalized Anxiety Score 07/10/2021 02/03/2021 07/05/2020 03/01/2020  Nervous, Anxious, on Edge 0 3 0 0  Control/stop worrying 0 2 0 0  Worry too much - different things 0 2 0 0  Trouble relaxing 0 2 0 2  Restless 0 0 0 3  Easily annoyed or irritable 0 0 0  0  Afraid - awful might happen 0 0 0 0  Total GAD 7 Score 0 9 0 5  Anxiety Difficulty Not difficult at all Not difficult at all - Not difficult at all    BP Readings from Last 3 Encounters:  07/10/21 134/72  06/02/21 128/78  05/15/21 122/82    Physical Exam Vitals and nursing note reviewed.  Constitutional:      General: She is not in acute distress.    Appearance: She is well-developed.  HENT:     Head: Normocephalic and atraumatic.     Right Ear: Tympanic membrane and ear canal normal.     Left Ear: Tympanic membrane and ear canal normal.     Nose:     Right Sinus: No maxillary sinus tenderness.     Left Sinus: No maxillary sinus tenderness.  Eyes:     General: No scleral icterus.       Right eye: No discharge.  Left eye: No discharge.     Conjunctiva/sclera: Conjunctivae normal.  Neck:     Thyroid: No thyromegaly.     Vascular: No carotid bruit.  Cardiovascular:     Rate and Rhythm: Normal rate and regular rhythm.     Pulses: Normal pulses.     Heart sounds: Normal heart sounds.  Pulmonary:     Effort: Pulmonary effort is normal. No respiratory distress.     Breath sounds: No wheezing.  Chest:  Breasts:    Right: No mass, nipple discharge, skin change or tenderness.     Left: No mass, nipple discharge, skin change or tenderness.  Abdominal:     General: Bowel sounds are normal.     Palpations: Abdomen is soft.     Tenderness: There is no abdominal tenderness.  Musculoskeletal:     Cervical back: Normal range of motion. No erythema.     Right lower leg: No edema.     Left lower leg: No edema.  Lymphadenopathy:     Cervical: No cervical adenopathy.  Skin:    General: Skin is warm and dry.     Findings: No rash.  Neurological:     Mental Status: She is alert and oriented to person, place, and time.     Cranial Nerves: No cranial nerve deficit.     Sensory: No sensory deficit.     Deep Tendon Reflexes: Reflexes are normal and symmetric.  Psychiatric:         Attention and Perception: Attention normal.        Mood and Affect: Mood normal.    Wt Readings from Last 3 Encounters:  07/10/21 147 lb 3.2 oz (66.8 kg)  06/02/21 141 lb 9.6 oz (64.2 kg)  05/15/21 140 lb 12.8 oz (63.9 kg)    BP 134/72    Pulse 77    Ht _0  (1.575 m)    Wt 147 lb 3.2 oz (66.8 kg)    SpO2 98%    BMI 26.92 kg/m   Assessment and Plan: 1. Gastroesophageal reflux disease, unspecified whether esophagitis present Symptoms well controlled on PPI No red flag signs such as weight loss, n/v, melena Will continue omeprazole PRN.  2. Age-related osteoporosis with current pathological fracture with routine healing, subsequent encounter Now on Prolia.  Taking vitamin D intermittently. Will check the level and advise - Comprehensive metabolic panel  3. Prediabetes - Hemoglobin A1c  4. Mild hyperlipidemia Tolerating statin medication without side effects at this time LDL is at goal of < 70 on current dose Continue same therapy without change at this time. - Lipid panel  5. Restless legs syndrome (RLS) Symptoms improved with Requip - rOPINIRole (REQUIP) 0.25 MG tablet; Take 1 tablet (0.25 mg total) by mouth at bedtime.  Dispense: 90 tablet; Refill: 1  6. Encounter for screening mammogram for breast cancer Schedule at [facility] in February - MM 3D SCREEN BREAST BILATERAL  7. Vitamin D deficiency - VITAMIN D 25 Hydroxy (Vit-D Deficiency, Fractures)   Partially dictated using Editor, commissioning. Any errors are unintentional.  Halina Maidens, MD [facility]  07/10/2021

## 2021-07-11 LAB — HEMOGLOBIN A1C
Est. average glucose Bld gHb Est-mCnc: 117 mg/dL
Hgb A1c MFr Bld: 5.7 % — ABNORMAL HIGH (ref 4.8–5.6)

## 2021-07-11 LAB — COMPREHENSIVE METABOLIC PANEL
ALT: 30 IU/L (ref 0–32)
AST: 25 IU/L (ref 0–40)
Albumin/Globulin Ratio: 2.4 — ABNORMAL HIGH (ref 1.2–2.2)
Albumin: 4.4 g/dL (ref 3.8–4.8)
Alkaline Phosphatase: 54 IU/L (ref 44–121)
BUN/Creatinine Ratio: 20 (ref 12–28)
BUN: 14 mg/dL (ref 8–27)
Bilirubin Total: 0.4 mg/dL (ref 0.0–1.2)
CO2: 21 mmol/L (ref 20–29)
Calcium: 9.1 mg/dL (ref 8.7–10.3)
Chloride: 104 mmol/L (ref 96–106)
Creatinine, Ser: 0.69 mg/dL (ref 0.57–1.00)
Globulin, Total: 1.8 g/dL (ref 1.5–4.5)
Glucose: 102 mg/dL — ABNORMAL HIGH (ref 70–99)
Potassium: 4.4 mmol/L (ref 3.5–5.2)
Sodium: 141 mmol/L (ref 134–144)
Total Protein: 6.2 g/dL (ref 6.0–8.5)
eGFR: 95 mL/min/{1.73_m2} (ref >59)

## 2021-07-11 LAB — LIPID PANEL
Chol/HDL Ratio: 3.1 ratio (ref 0.0–4.4)
Cholesterol, Total: 199 mg/dL (ref 100–199)
HDL: 64 mg/dL (ref >39)
LDL Chol Calc (NIH): 119 mg/dL — ABNORMAL HIGH (ref 0–99)
Triglycerides: 87 mg/dL (ref 0–149)
VLDL Cholesterol Cal: 16 mg/dL (ref 5–40)

## 2021-07-11 LAB — VITAMIN D 25 HYDROXY (VIT D DEFICIENCY, FRACTURES): Vit D, 25-Hydroxy: 32.1 ng/mL (ref 30.0–100.0)

## 2021-08-07 ENCOUNTER — Telehealth: Payer: Self-pay

## 2021-08-07 NOTE — Telephone Encounter (Signed)
Called pt as a reminder to call and schedule mammogram. Pt verbalized understanding.  KP 

## 2021-10-05 ENCOUNTER — Other Ambulatory Visit: Payer: Self-pay

## 2021-10-05 ENCOUNTER — Encounter: Payer: Self-pay | Admitting: Internal Medicine

## 2021-10-05 ENCOUNTER — Ambulatory Visit (INDEPENDENT_AMBULATORY_CARE_PROVIDER_SITE_OTHER): Payer: Medicare Other | Admitting: Internal Medicine

## 2021-10-05 VITALS — BP 108/76 | HR 75 | Temp 98.5°F | Ht 62.0 in | Wt 143.0 lb

## 2021-10-05 DIAGNOSIS — M2550 Pain in unspecified joint: Secondary | ICD-10-CM | POA: Diagnosis not present

## 2021-10-05 DIAGNOSIS — J0100 Acute maxillary sinusitis, unspecified: Secondary | ICD-10-CM | POA: Diagnosis not present

## 2021-10-05 DIAGNOSIS — K219 Gastro-esophageal reflux disease without esophagitis: Secondary | ICD-10-CM | POA: Diagnosis not present

## 2021-10-05 MED ORDER — PROMETHAZINE-DM 6.25-15 MG/5ML PO SYRP: 5.0000 mL | ORAL_SOLUTION | Freq: Four times a day (QID) | ORAL | 0 refills | Status: DC | PRN

## 2021-10-05 MED ORDER — TIZANIDINE HCL 2 MG PO TABS: 2.0000 mg | ORAL_TABLET | Freq: Every day | ORAL | 0 refills | Status: DC

## 2021-10-05 MED ORDER — OMEPRAZOLE 20 MG PO CPDR: 20.0000 mg | DELAYED_RELEASE_CAPSULE | Freq: Every day | ORAL | 1 refills | Status: DC

## 2021-10-05 MED ORDER — CEFDINIR 300 MG PO CAPS
300.0000 mg | ORAL_CAPSULE | Freq: Two times a day (BID) | ORAL | 0 refills | Status: AC
Start: 2021-10-05 — End: 2021-10-15

## 2021-10-05 NOTE — Progress Notes (Signed)
? ? ?Date:  10/05/2021  ? ?Name:  Roberta Ward   DOB:  08/06/1955   MRN:  [id number] ? ? ?Chief Complaint: Cough ? ?Cough ?This is a new problem. The current episode started in the past 7 days. The problem has been waxing and waning. The problem occurs every few minutes. The cough is Productive of sputum. Associated symptoms include chest pain (with coughing), rhinorrhea and a sore throat. Pertinent negatives include no chills, fever, shortness of breath or wheezing.  ?Gastroesophageal Reflux ?She complains of chest pain (with coughing), coughing and a sore throat. She reports no nausea or no wheezing. This is a recurrent problem. The problem occurs occasionally. The problem has been gradually improving. Pertinent negatives include no fatigue. She has tried a PPI for the symptoms. The treatment provided significant relief.  ?Arthritis ?Presents for follow-up visit. Symptom course: waxing and waning. Affected location: multiple sites, esp right shoulder. Pertinent negatives include no diarrhea, fatigue or fever. Compliance with medications: most improvement with zanaflex 2 mg hs.  ? ?Lab Results  ?Component Value Date  ? NA 141 07/10/2021  ? K 4.4 07/10/2021  ? CO2 21 07/10/2021  ? GLUCOSE 102 (H) 07/10/2021  ? BUN 14 07/10/2021  ? CREATININE 0.69 07/10/2021  ? CALCIUM 9.1 07/10/2021  ? EGFR 95 07/10/2021  ? GFRNONAA 86 07/05/2020  ? ?Lab Results  ?Component Value Date  ? CHOL 199 07/10/2021  ? HDL 64 07/10/2021  ? LDLCALC 119 (H) 07/10/2021  ? TRIG 87 07/10/2021  ? CHOLHDL 3.1 07/10/2021  ? ?Lab Results  ?Component Value Date  ? TSH 1.990 02/03/2021  ? ?Lab Results  ?Component Value Date  ? HGBA1C 5.7 (H) 07/10/2021  ? ?Lab Results  ?Component Value Date  ? WBC 6.3 06/02/2021  ? HGB 15.2 06/02/2021  ? HCT 45.4 06/02/2021  ? MCV 96 06/02/2021  ? PLT 287 06/02/2021  ? ?Lab Results  ?Component Value Date  ? ALT 30 07/10/2021  ? AST 25 07/10/2021  ? ALKPHOS 54 07/10/2021  ? BILITOT 0.4 07/10/2021  ? ?Lab Results   ?Component Value Date  ? VD25OH 32.1 07/10/2021  ?  ? ?Review of Systems  ?Constitutional:  Negative for chills, diaphoresis, fatigue and fever.  ?HENT:  Positive for rhinorrhea and sore throat.   ?Respiratory:  Positive for cough. Negative for chest tightness, shortness of breath and wheezing.   ?Cardiovascular:  Positive for chest pain (with coughing).  ?Gastrointestinal:  Negative for anal bleeding, diarrhea, nausea and vomiting.  ?Musculoskeletal:  Positive for arthralgias and arthritis.  ?Psychiatric/Behavioral:  Positive for sleep disturbance. Negative for dysphoric mood. The patient is not nervous/anxious.   ? ?Patient Active Problem List  ? Diagnosis Date Noted  ? Shoulder tendonitis, right 06/02/2021  ? Tremor 02/03/2021  ? Episode of moderate major depression ([place]) 02/03/2021  ? Pain, joint, multiple sites 02/03/2021  ? Gastroesophageal reflux disease 09/20/2020  ? Mild hyperlipidemia 07/06/2020  ? Prediabetes 04/01/2020  ? Closed compression fracture of L2 lumbar vertebra, initial encounter ([place]) 03/02/2020  ? Age-related osteoporosis with current pathological fracture 03/01/2020  ? Primary insomnia 03/01/2020  ? Left foot pain 03/01/2020  ? Primary osteoarthritis of left knee 03/01/2020  ? Lumbar back pain 03/01/2020  ? Patient is Jehovah's Witness 03/01/2020  ? Colon polyp 08/14/2016  ? Vitamin D deficiency 01/25/2016  ? ? ?Allergies  ?Allergen Reactions  ? Sulfa Antibiotics Hives and Itching  ? Latex   ? Talc   ?  Other reaction(s): Angioedema  ? ? ?  Past Surgical History:  ?Procedure Laterality Date  ? ABDOMINAL HYSTERECTOMY    ? KNEE CARTILAGE SURGERY Left   ? PARTIAL HYSTERECTOMY  1980  ? Rt ovary still present and cervix still present.  ? TONSILECTOMY/ADENOIDECTOMY WITH MYRINGOTOMY    ? ? ?Social History  ? ?Tobacco Use  ? Smoking status: Never  ? Smokeless tobacco: Never  ?Vaping Use  ? Vaping Use: Never used  ?Substance Use Topics  ? Alcohol use: Yes  ?  Comment: rare- occasional  ? Drug use:  Never  ? ? ? ?Medication list has been reviewed and updated. ? ?Current Meds  ?Medication Sig  ? Ascorbic Acid (VITAMIN C) 1000 MG tablet Take 1,000 mg by mouth daily.  ? Cholecalciferol (VITAMIN D) 50 MCG (2000 UT) CAPS Take by mouth.  ? denosumab (PROLIA) 60 MG/ML SOSY injection Inject into the skin. q6 months  ? magnesium oxide (MAG-OX) 400 MG tablet Take by mouth.  ? Melatonin 10 MG CAPS Take by mouth.  ? omeprazole (PRILOSEC) 20 MG capsule Take 1 capsule (20 mg total) by mouth daily.  ? rOPINIRole (REQUIP) 0.25 MG tablet Take 1 tablet (0.25 mg total) by mouth at bedtime.  ? ? ?PHQ 2/9 Scores 10/05/2021 07/10/2021 05/15/2021 02/03/2021  ?PHQ - 2 Score 0 0 0 4  ?PHQ- 9 Score _0 ? ? ?GAD 7 : Generalized Anxiety Score 10/05/2021 07/10/2021 02/03/2021 07/05/2020  ?Nervous, Anxious, on Edge 0 0 3 0  ?Control/stop worrying 0 0 2 0  ?Worry too much - different things 0 0 2 0  ?Trouble relaxing 0 0 2 0  ?Restless 0 0 0 0  ?Easily annoyed or irritable 0 0 0 0  ?Afraid - awful might happen 0 0 0 0  ?Total GAD 7 Score 0 0 9 0  ?Anxiety Difficulty Not difficult at all Not difficult at all Not difficult at all -  ? ? ?BP Readings from Last 3 Encounters:  ?10/05/21 108/76  ?07/10/21 134/72  ?06/02/21 128/78  ? ? ?Physical Exam ?Vitals and nursing note reviewed.  ?Constitutional:   ?   General: She is not in acute distress. ?   Appearance: Normal appearance. She is well-developed.  ?HENT:  ?   Head: Normocephalic and atraumatic.  ?   Right Ear: Tympanic membrane and ear canal normal.  ?   Left Ear: Tympanic membrane and ear canal normal.  ?   Nose:  ?   Right Sinus: Maxillary sinus tenderness present. No frontal sinus tenderness.  ?   Left Sinus: Maxillary sinus tenderness present. No frontal sinus tenderness.  ?   Mouth/Throat:  ?   Pharynx: Pharyngeal swelling and posterior oropharyngeal erythema present. No oropharyngeal exudate.  ?Cardiovascular:  ?   Rate and Rhythm: Normal rate and regular rhythm.  ?   Pulses: Normal  pulses.  ?   Heart sounds: No murmur heard. ?Pulmonary:  ?   Effort: Pulmonary effort is normal. No respiratory distress.  ?   Breath sounds: No wheezing or rhonchi.  ?Musculoskeletal:  ?   Cervical back: Normal range of motion.  ?Lymphadenopathy:  ?   Cervical: No cervical adenopathy.  ?Skin: ?   General: Skin is warm and dry.  ?   Findings: No rash.  ?Neurological:  ?   Mental Status: She is alert and oriented to person, place, and time.  ?Psychiatric:     ?   Mood and Affect: Mood normal.     ?   Behavior:  Behavior normal.  ? ? ?Wt Readings from Last 3 Encounters:  ?10/05/21 143 lb (64.9 kg)  ?07/10/21 147 lb 3.2 oz (66.8 kg)  ?06/02/21 141 lb 9.6 oz (64.2 kg)  ? ? ?BP 108/76   Pulse 75   Temp 98.5 ?F (36.9 ?C) (Oral)   Ht _0  (1.575 m)   Wt 143 lb (64.9 kg)   SpO2 96%   BMI 26.16 kg/m?  ? ?Assessment and Plan: ?1. Acute non-recurrent maxillary sinusitis ?Continue Chlortrimeton or similar antihistamine and Mucinex ?- promethazine-dextromethorphan (PROMETHAZINE-DM) 6.25-15 MG/5ML syrup; Take 5 mLs by mouth 4 (four) times daily as needed for cough.  Dispense: 118 mL; Refill: 0 ?- cefdinir (OMNICEF) 300 MG capsule; Take 1 capsule (300 mg total) by mouth 2 (two) times daily for 10 days.  Dispense: 20 capsule; Refill: 0 ? ?2. Gastroesophageal reflux disease, unspecified whether esophagitis present ?Improved on omeprazole daily ?- omeprazole (PRILOSEC) 20 MG capsule; Take 1 capsule (20 mg total) by mouth daily.  Dispense: 90 capsule; Refill: 1 ? ?3. Polyarthralgia ?Seen by Ortho and Rheumatology. ?She requests refill of zanaflex which has been the most beneficial ?- tiZANidine (ZANAFLEX) 2 MG tablet; Take 1 tablet (2 mg total) by mouth at bedtime.  Dispense: 90 tablet; Refill: 0 ? ? ?Partially dictated using Editor, commissioning. Any errors are unintentional. ? ?Halina Maidens, MD ?[facility] ?[facility] ? ?10/05/2021 ? ? ? ? ? ?

## 2021-10-27 ENCOUNTER — Ambulatory Visit
Admission: RE | Admit: 2021-10-27 | Discharge: 2021-10-27 | Disposition: A | Discharge status: Home / Self Care | Payer: Medicare Other | Source: Ambulatory Visit | Attending: Internal Medicine | Admitting: Internal Medicine

## 2021-10-27 DIAGNOSIS — Z1231 Encounter for screening mammogram for malignant neoplasm of breast: Secondary | ICD-10-CM | POA: Diagnosis present

## 2021-10-30 DIAGNOSIS — M249 Joint derangement, unspecified: Secondary | ICD-10-CM | POA: Insufficient documentation

## 2022-01-10 ENCOUNTER — Ambulatory Visit (INDEPENDENT_AMBULATORY_CARE_PROVIDER_SITE_OTHER): Payer: Medicare Other | Admitting: Internal Medicine

## 2022-01-10 ENCOUNTER — Other Ambulatory Visit: Payer: Self-pay | Admitting: Internal Medicine

## 2022-01-10 ENCOUNTER — Encounter: Payer: Self-pay | Admitting: Internal Medicine

## 2022-01-10 VITALS — BP 142/82 | HR 75 | Ht 62.0 in | Wt 146.0 lb

## 2022-01-10 DIAGNOSIS — I10 Essential (primary) hypertension: Secondary | ICD-10-CM | POA: Insufficient documentation

## 2022-01-10 DIAGNOSIS — R251 Tremor, unspecified: Secondary | ICD-10-CM | POA: Diagnosis not present

## 2022-01-10 DIAGNOSIS — R030 Elevated blood-pressure reading, without diagnosis of hypertension: Secondary | ICD-10-CM | POA: Insufficient documentation

## 2022-01-10 DIAGNOSIS — F39 Unspecified mood [affective] disorder: Secondary | ICD-10-CM

## 2022-01-10 MED ORDER — ATENOLOL 25 MG PO TABS: 25.0000 mg | ORAL_TABLET | Freq: Every day | ORAL | 0 refills | Status: DC

## 2022-01-10 NOTE — Progress Notes (Unsigned)
Date:  01/10/2022   Name:  Roberta Ward   DOB:  06-03-1956   MRN:  [id number]   Chief Complaint: No chief complaint on file.  HPI Tremor -  Lab Results  Component Value Date   NA 141 07/10/2021   K 4.4 07/10/2021   CO2 21 07/10/2021   GLUCOSE 102 (H) 07/10/2021   BUN 14 07/10/2021   CREATININE 0.69 07/10/2021   CALCIUM 9.1 07/10/2021   EGFR 95 07/10/2021   GFRNONAA 86 07/05/2020   Lab Results  Component Value Date   CHOL 199 07/10/2021   HDL 64 07/10/2021   LDLCALC 119 (H) 07/10/2021   TRIG 87 07/10/2021   CHOLHDL 3.1 07/10/2021   Lab Results  Component Value Date   TSH 1.990 02/03/2021   Lab Results  Component Value Date   HGBA1C 5.7 (H) 07/10/2021   Lab Results  Component Value Date   WBC 6.3 06/02/2021   HGB 15.2 06/02/2021   HCT 45.4 06/02/2021   MCV 96 06/02/2021   PLT 287 06/02/2021   Lab Results  Component Value Date   ALT 30 07/10/2021   AST 25 07/10/2021   ALKPHOS 54 07/10/2021   BILITOT 0.4 07/10/2021   Lab Results  Component Value Date   VD25OH 32.1 07/10/2021     Review of Systems  Patient Active Problem List   Diagnosis Date Noted   Shoulder tendonitis, right 06/02/2021   Tremor 02/03/2021   Episode of moderate major depression ([place]) 02/03/2021   Polyarthralgia 02/03/2021   Gastroesophageal reflux disease 09/20/2020   Mild hyperlipidemia 07/06/2020   Prediabetes 04/01/2020   Closed compression fracture of L2 lumbar vertebra, initial encounter ([place]) 03/02/2020   Age-related osteoporosis with current pathological fracture 03/01/2020   Primary insomnia 03/01/2020   Left foot pain 03/01/2020   Primary osteoarthritis of left knee 03/01/2020   Lumbar back pain 03/01/2020   Patient is Jehovah's Witness 03/01/2020   Colon polyp 08/14/2016   Vitamin D deficiency 01/25/2016    Allergies  Allergen Reactions   Sulfa Antibiotics Hives and Itching   Latex    Talc     Other reaction(s): Angioedema    Past Surgical History:   Procedure Laterality Date   ABDOMINAL HYSTERECTOMY     KNEE CARTILAGE SURGERY Left    PARTIAL HYSTERECTOMY  1980   Rt ovary still present and cervix still present.   TONSILECTOMY/ADENOIDECTOMY WITH MYRINGOTOMY      Social History   Tobacco Use   Smoking status: Never   Smokeless tobacco: Never  Vaping Use   Vaping Use: Never used  Substance Use Topics   Alcohol use: Yes    Comment: rare- occasional   Drug use: Never     Medication list has been reviewed and updated.  No outpatient medications have been marked as taking for the 01/10/22 encounter (Orders Only) with Glean Hess, MD.       10/05/2021   11:37 AM 07/10/2021    8:02 AM 02/03/2021    2:18 PM 07/05/2020    7:58 AM  GAD 7 : Generalized Anxiety Score  Nervous, Anxious, on Edge 0 0 3 0  Control/stop worrying 0 0 2 0  Worry too much - different things 0 0 2 0  Trouble relaxing 0 0 2 0  Restless 0 0 0 0  Easily annoyed or irritable 0 0 0 0  Afraid - awful might happen 0 0 0 0  Total GAD 7 Score 0 0 9 0  Anxiety Difficulty Not difficult at all Not difficult at all Not difficult at all        10/05/2021   11:37 AM  Depression screen PHQ 2/9  Decreased Interest 0  Down, Depressed, Hopeless 0  PHQ - 2 Score 0  Altered sleeping 3  Tired, decreased energy 2  Change in appetite 0  Feeling bad or failure about yourself  0  Trouble concentrating 0  Moving slowly or fidgety/restless 0  Suicidal thoughts 0  PHQ-9 Score 5  Difficult doing work/chores Not difficult at all    BP Readings from Last 3 Encounters:  10/05/21 108/76  07/10/21 134/72  06/02/21 128/78    Physical Exam  Wt Readings from Last 3 Encounters:  10/05/21 143 lb (64.9 kg)  07/10/21 147 lb 3.2 oz (66.8 kg)  06/02/21 141 lb 9.6 oz (64.2 kg)    There were no vitals taken for this visit.  Assessment and Plan:

## 2022-01-10 NOTE — Progress Notes (Signed)
Date:  01/10/2022   Name:  Roberta Ward   DOB:  1955-08-12   MRN:  [id number]   Chief Complaint: Tremors (Started years ago but now its much worse. Started 3 months ago with worsening. An Example is when she is putting on make up she has to hold her hands together to try to stop the shaking.) No family hx of tremor.   Able to do ADLs.  Handwriting is normal because she presses down.  She denies head tremor, difficulty reading, imbalance or falls.   HPI  Lab Results  Component Value Date   NA 141 07/10/2021   K 4.4 07/10/2021   CO2 21 07/10/2021   GLUCOSE 102 (H) 07/10/2021   BUN 14 07/10/2021   CREATININE 0.69 07/10/2021   CALCIUM 9.1 07/10/2021   EGFR 95 07/10/2021   GFRNONAA 86 07/05/2020   Lab Results  Component Value Date   CHOL 199 07/10/2021   HDL 64 07/10/2021   LDLCALC 119 (H) 07/10/2021   TRIG 87 07/10/2021   CHOLHDL 3.1 07/10/2021   Lab Results  Component Value Date   TSH 1.990 02/03/2021   Lab Results  Component Value Date   HGBA1C 5.7 (H) 07/10/2021   Lab Results  Component Value Date   WBC 6.3 06/02/2021   HGB 15.2 06/02/2021   HCT 45.4 06/02/2021   MCV 96 06/02/2021   PLT 287 06/02/2021   Lab Results  Component Value Date   ALT 30 07/10/2021   AST 25 07/10/2021   ALKPHOS 54 07/10/2021   BILITOT 0.4 07/10/2021   Lab Results  Component Value Date   VD25OH 32.1 07/10/2021     Review of Systems  Constitutional:  Negative for chills, fatigue and fever.  Respiratory:  Negative for chest tightness and shortness of breath.   Cardiovascular:  Negative for chest pain.  Musculoskeletal:  Positive for arthralgias (knees) and back pain. Negative for gait problem.  Neurological:  Positive for tremors. Negative for dizziness, weakness, numbness and headaches.  Psychiatric/Behavioral:  Negative for dysphoric mood and sleep disturbance. The patient is not nervous/anxious.     Patient Active Problem List   Diagnosis Date Noted   Shoulder  tendonitis, right 06/02/2021   Tremor 02/03/2021   Episode of moderate major depression ([place]) 02/03/2021   Polyarthralgia 02/03/2021   Gastroesophageal reflux disease 09/20/2020   Mild hyperlipidemia 07/06/2020   Prediabetes 04/01/2020   Closed compression fracture of L2 lumbar vertebra, initial encounter ([place]) 03/02/2020   Age-related osteoporosis with current pathological fracture 03/01/2020   Primary insomnia 03/01/2020   Left foot pain 03/01/2020   Primary osteoarthritis of left knee 03/01/2020   Lumbar back pain 03/01/2020   Patient is Jehovah's Witness 03/01/2020   Colon polyp 08/14/2016   Vitamin D deficiency 01/25/2016    Allergies  Allergen Reactions   Sulfa Antibiotics Hives and Itching   Latex    Talc     Other reaction(s): Angioedema    Past Surgical History:  Procedure Laterality Date   ABDOMINAL HYSTERECTOMY     KNEE CARTILAGE SURGERY Left    PARTIAL HYSTERECTOMY  1980   Rt ovary still present and cervix still present.   TONSILECTOMY/ADENOIDECTOMY WITH MYRINGOTOMY      Social History   Tobacco Use   Smoking status: Never   Smokeless tobacco: Never  Vaping Use   Vaping Use: Never used  Substance Use Topics   Alcohol use: Yes    Comment: rare- occasional   Drug use: Never  Medication list has been reviewed and updated.  Current Meds  Medication Sig   Ascorbic Acid (VITAMIN C) 1000 MG tablet Take 1,000 mg by mouth daily.   atenolol (TENORMIN) 25 MG tablet Take 1 tablet (25 mg total) by mouth daily.   Cholecalciferol (VITAMIN D) 50 MCG (2000 UT) CAPS Take by mouth.   denosumab (PROLIA) 60 MG/ML SOSY injection Inject into the skin. q6 months   magnesium oxide (MAG-OX) 400 MG tablet Take by mouth.   Melatonin 10 MG CAPS Take by mouth.   omeprazole (PRILOSEC) 20 MG capsule Take 1 capsule (20 mg total) by mouth daily.   rOPINIRole (REQUIP) 0.25 MG tablet Take 1 tablet (0.25 mg total) by mouth at bedtime.   tiZANidine (ZANAFLEX) 2 MG tablet Take 1  tablet (2 mg total) by mouth at bedtime.       01/10/2022    1:54 PM 10/05/2021   11:37 AM 07/10/2021    8:02 AM 02/03/2021    2:18 PM  GAD 7 : Generalized Anxiety Score  Nervous, Anxious, on Edge 0 0 0 3  Control/stop worrying 0 0 0 2  Worry too much - different things 0 0 0 2  Trouble relaxing 0 0 0 2  Restless 0 0 0 0  Easily annoyed or irritable 0 0 0 0  Afraid - awful might happen 0 0 0 0  Total GAD 7 Score 0 0 0 9  Anxiety Difficulty Not difficult at all Not difficult at all Not difficult at all Not difficult at all       01/10/2022    1:54 PM  Depression screen PHQ 2/9  Decreased Interest 0  Down, Depressed, Hopeless 0  PHQ - 2 Score 0  Altered sleeping 2  Tired, decreased energy 0  Change in appetite 0  Feeling bad or failure about yourself  0  Trouble concentrating 0  Moving slowly or fidgety/restless 0  Suicidal thoughts 0  PHQ-9 Score 2  Difficult doing work/chores Not difficult at all    BP Readings from Last 3 Encounters:  01/10/22 (!) 142/82  10/05/21 108/76  07/10/21 134/72    Physical Exam Vitals and nursing note reviewed.  Constitutional:      General: She is not in acute distress.    Appearance: Normal appearance. She is well-developed.  HENT:     Head: Normocephalic and atraumatic.  Cardiovascular:     Rate and Rhythm: Normal rate and regular rhythm.  Pulmonary:     Effort: Pulmonary effort is normal. No respiratory distress.     Breath sounds: No wheezing or rhonchi.  Musculoskeletal:     Cervical back: Normal range of motion.     Right lower leg: No edema.     Left lower leg: No edema.  Lymphadenopathy:     Cervical: No cervical adenopathy.  Skin:    General: Skin is warm and dry.     Findings: No rash.  Neurological:     Mental Status: She is alert and oriented to person, place, and time.     Cranial Nerves: Cranial nerves 2-12 are intact.     Sensory: Sensation is intact.     Motor: Tremor (intention tremor both UEs) present. No  weakness, atrophy, abnormal muscle tone, seizure activity or pronator drift.     Coordination: Coordination is intact.     Gait: Gait is intact.  Psychiatric:        Mood and Affect: Mood normal.  Behavior: Behavior normal.     Wt Readings from Last 3 Encounters:  01/10/22 146 lb (66.2 kg)  10/05/21 143 lb (64.9 kg)  07/10/21 147 lb 3.2 oz (66.8 kg)    BP (!) 142/82 (BP Location: Right Arm, Cuff Size: Normal)   Pulse 75   Ht '5\' 2"'  (1.575 m)   Wt 146 lb (66.2 kg)   SpO2 96%   BMI 26.70 kg/m   Assessment and Plan: 1. 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 Will try low dose beta blocker for tremor.  If worsening, can refer to Neurology. Follow up in 3 months - atenolol (TENORMIN) 25 MG tablet; Take 1 tablet (25 mg total) by mouth daily.  Dispense: 90 tablet; Refill: 0  2. Elevated BP without diagnosis of hypertension Start beta blocker. Follow up in 3 months - atenolol (TENORMIN) 25 MG tablet; Take 1 tablet (25 mg total) by mouth daily.  Dispense: 90 tablet; Refill: 0  3. Mood disorder ([place]) Doing well without medication or counseling at this time.   Partially dictated using Editor, commissioning. Any errors are unintentional.  Halina Maidens, MD [facility]  01/10/2022

## 2022-02-05 DIAGNOSIS — M5416 Radiculopathy, lumbar region: Secondary | ICD-10-CM | POA: Insufficient documentation

## 2022-02-19 ENCOUNTER — Ambulatory Visit: Payer: Self-pay

## 2022-02-19 NOTE — Telephone Encounter (Signed)
Summary: covid   Pt is still positive for covid after 9 days / pt has questions about going out with mask/ please advise      Chief Complaint: covid questions Symptoms: cough  Frequency: 9/10 of precautions Pertinent Negatives: Patient denies fever,  Disposition: [] ED /[] Urgent Care (no appt availability in office) / [] Appointment(In office/virtual)/ []  [facility]/ [] Home Care/ [] Refused Recommended Disposition /[]  Mobile Bus/ [x]  Follow-up with PCP Additional Notes: advised pt she could potentially test positive for covid up to 90 days. Discussed isolation precautions, Advised pt that tomorrow is the last day she needs to wear mask around others. Pt also stated she was having frequent BM's. Pt stated she has had sx 45for months before Covid. Advised pt to make sure she avoids greasy, fried, spicy foods. Also, to monitor for the presence of blood on BM. Pt denies watery BM. Starts out formed then pasty as the day wears on. Pt has stopped drinking coffee. Looked up meds and none indicate her issue.   Reason for Disposition  General information question, no triage required and triager able to answer question  Answer Assessment - Initial Assessment Questions 1. REASON FOR CALL or QUESTION: "What is your reason for calling today?" or "How can I best help you?" or "What question do you have that I can help answer?"     Summary: covid   Pt is still positive for covid after 9 days / pt has questions about going out with mask/ please advise  Protocols used: Information Only Call - No Triage-A-AH

## 2022-02-26 DIAGNOSIS — S161XXA Strain of muscle, fascia and tendon at neck level, initial encounter: Secondary | ICD-10-CM | POA: Insufficient documentation

## 2022-02-26 DIAGNOSIS — M47812 Spondylosis without myelopathy or radiculopathy, cervical region: Secondary | ICD-10-CM | POA: Insufficient documentation

## 2022-05-02 ENCOUNTER — Telehealth: Payer: Self-pay | Admitting: Internal Medicine

## 2022-05-02 NOTE — Telephone Encounter (Signed)
Copied from [place] [phone]. Topic: General - Other >> May 02, 2022  9:02 AM Ludger Nutting wrote: Patient wants to know how often she needs to have a bone density test done. Patient also wants to know if she still needs a shingles vaccine. Please advise patient.

## 2022-05-02 NOTE — Telephone Encounter (Signed)
Called pt let her know that she is due for a bone density until 08/2022. Pt stated she thought she had the shingles vaccine. Told pt to call the pharmacy and get them to send a fax with the dates. Pt verbalized understanding.  KP

## 2022-05-15 NOTE — Progress Notes (Unsigned)
Subjective:   Roberta Ward is a 66 y.o. female who presents for Medicare Annual (Subsequent) preventive examination.  I connected with  [name] on 05/16/22 by a audio enabled telemedicine application and verified that I am speaking with the correct person using two identifiers.  Patient Location: Home  Provider Location: Office/Clinic  I discussed the limitations of evaluation and management by telemedicine. The patient expressed understanding and agreed to proceed.    Review of Systems    Defer to PCP Cardiac Risk Factors include: advanced age (>45men, >83 women)     Objective:    Today's Vitals   05/15/22 1654 05/16/22 0817  Weight: 146 lb (66.2 kg)   Height: 5\' 2"  (1.575 m)   PainSc: 0-No pain 5    Body mass index is 26.7 kg/m.     05/16/2022    8:21 AM 05/15/2021    8:32 AM  Advanced Directives  Does Patient Have a Medical Advance Directive? No No  Would patient like information on creating a medical advance directive? No - Patient declined Yes (MAU/Ambulatory/Procedural Areas - Information given)    Current Medications (verified) Outpatient Encounter Medications as of 05/16/2022  Medication Sig   Ascorbic Acid (VITAMIN C) 1000 MG tablet Take 1,000 mg by mouth daily.   atenolol (TENORMIN) 25 MG tablet Take 1 tablet (25 mg total) by mouth daily.   Cholecalciferol (VITAMIN D) 50 MCG (2000 UT) CAPS Take by mouth.   denosumab (PROLIA) 60 MG/ML SOSY injection Inject into the skin. q6 months   magnesium oxide (MAG-OX) 400 MG tablet Take by mouth.   Melatonin 10 MG CAPS Take by mouth.   omeprazole (PRILOSEC) 20 MG capsule Take 1 capsule (20 mg total) by mouth daily.   rOPINIRole (REQUIP) 0.25 MG tablet Take 1 tablet (0.25 mg total) by mouth at bedtime.   tiZANidine (ZANAFLEX) 2 MG tablet Take 1 tablet (2 mg total) by mouth at bedtime.   No facility-administered encounter medications on file as of 05/16/2022.    Allergies (verified) Sulfa  antibiotics, Latex, and Talc   History: History reviewed. No pertinent past medical history. Past Surgical History:  Procedure Laterality Date   ABDOMINAL HYSTERECTOMY     KNEE CARTILAGE SURGERY Left    PARTIAL HYSTERECTOMY  1980   Rt ovary still present and cervix still present.   TONSILECTOMY/ADENOIDECTOMY WITH MYRINGOTOMY     Family History  Problem Relation Age of Onset   Breast cancer Mother    Breast cancer Maternal Aunt    Social History   Socioeconomic History   Marital status: Married    Spouse name: Orlando    Number of children: 1   Years of education: Not on file   Highest education level: Not on file  Occupational History   Not on file  Tobacco Use   Smoking status: Never   Smokeless tobacco: Never  Vaping Use   Vaping Use: Never used  Substance and Sexual Activity   Alcohol use: Yes    Comment: rare- occasional   Drug use: Never   Sexual activity: Not Currently  Other Topics Concern   Not on file  Social History Narrative   Not on file   Social Determinants of Health   Financial Resource Strain: Low Risk  (05/16/2022)   Overall Financial Resource Strain (CARDIA)    Difficulty of Paying Living Expenses: Not hard at all  Food Insecurity: No Food Insecurity (05/16/2022)   Hunger Vital Sign    Worried About Running Out  of Food in the Last Year: Never true    Ran Out of Food in the Last Year: Never true  Transportation Needs: No Transportation Needs (05/16/2022)   PRAPARE - Administrator, Civil Service (Medical): No    Lack of Transportation (Non-Medical): No  Physical Activity: Inactive (05/16/2022)   Exercise Vital Sign    Days of Exercise per Week: 0 days    Minutes of Exercise per Session: 0 min  Stress: No Stress Concern Present (05/16/2022)   [employer] of Occupational Health - Occupational Stress Questionnaire    Feeling of Stress : Not at all  Social Connections: Moderately Integrated (05/16/2022)   Social Connection  and Isolation Panel [NHANES]    Frequency of Communication with Friends and Family: More than three times a week    Frequency of Social Gatherings with Friends and Family: More than three times a week    Attends Religious Services: More than 4 times per year    Active Member of [employer] or Organizations: No    Attends Engineer, structural: Never    Marital Status: Married    Tobacco Counseling Counseling given: Not Answered   Clinical Intake:  Pre-visit preparation completed: Yes  Pain : 0-10 Pain Score: 5  Pain Location: Arm Pain Orientation: Right Pain Radiating Towards: right shoulder, right lower back Pain Descriptors / Indicators: Aching Pain Onset: In the past 7 days Pain Frequency: Intermittent     BMI - recorded: 26.7 Nutritional Status: BMI 25 -29 Overweight Diabetes: No     Diabetic? Yes, prediabetes.  Interpreter Needed?: No  Information entered by :: [name], CMA   Activities of Daily Living    05/16/2022    8:22 AM 10/05/2021   11:37 AM  In your present state of health, do you have any difficulty performing the following activities:  Hearing? 0 0  Vision? 0 0  Difficulty concentrating or making decisions? 0 0  Walking or climbing stairs? 1 0  Dressing or bathing? 0 0  Doing errands, shopping? 0 0  Preparing Food and eating ? N   Using the Toilet? N   In the past six months, have you accidently leaked urine? N   Do you have problems with loss of bowel control? N   Managing your Medications? N   Managing your Finances? N   Housekeeping or managing your Housekeeping? N     Patient Care Team: Reubin Milan, MD as PCP - General (Internal Medicine) Tedd Sias Marlana Salvage, MD as Physician Assistant (Endocrinology) Patterson Hammersmith, MD as Consulting Physician (Rheumatology) Jerrel Ivory, DO as Consulting Physician (Sports Medicine) Elijah Birk, MD as Referring Physician (Physical Medicine and Rehabilitation)  Indicate any  recent Medical Services you may have received from other than [facility] providers in the past year (date may be approximate).     Assessment:   This is a routine wellness examination for [place].  Hearing/Vision screen Hearing Screening - Comments:: No concerns. Vision Screening - Comments:: Wears glasses.  Dietary issues and exercise activities discussed: Current Exercise Habits: The patient does not participate in regular exercise at present   Goals Addressed   None   Depression Screen    05/16/2022    8:19 AM 01/10/2022    1:54 PM 10/05/2021   11:37 AM 07/10/2021    8:02 AM 05/15/2021    8:31 AM 02/03/2021    2:18 PM 07/05/2020    7:58 AM  PHQ 2/9 Scores  PHQ -  2 Score 0 0 0 0 0 4 0  PHQ- 9 Score 4 2 5 1 6 11  0    Fall Risk    05/16/2022    8:21 AM 01/10/2022    1:54 PM 10/05/2021   11:37 AM 07/10/2021    8:02 AM 05/15/2021    8:33 AM  Fall Risk   Falls in the past year? 0  0 0 0  Number falls in past yr: 0 1 0 0 0  Injury with Fall? 0 0 0 0 0  Risk for fall due to : No Fall Risks History of fall(s) No Fall Risks No Fall Risks No Fall Risks  Follow up Falls evaluation completed Falls evaluation completed Falls evaluation completed Falls evaluation completed Falls prevention discussed    FALL RISK PREVENTION PERTAINING TO THE HOME:  Any stairs in or around the home? Yes  If so, are there any without handrails? Yes  Home free of loose throw rugs in walkways, pet beds, electrical cords, etc? Yes  Adequate lighting in your home to reduce risk of falls? Yes   ASSISTIVE DEVICES UTILIZED TO PREVENT FALLS:  Life alert? No  Use of a cane, walker or w/c? No  Grab bars in the bathroom? No  Shower chair or bench in shower? No  Elevated toilet seat or a handicapped toilet? No    Cognitive Function:        05/16/2022    8:22 AM  6CIT Screen  What Year? 0 points  What month? 0 points  What time? 0 points  Count back from 20 0 points  Months in reverse 0 points   Repeat phrase 0 points  Total Score 0 points    Immunizations Immunization History  Administered Date(s) Administered   Fluad Quad(high Dose 65+) 07/05/2020   Influenza, High Dose Seasonal PF 04/23/2021   Influenza-Unspecified 04/25/2022   PFIZER(Purple Top)SARS-COV-2 Vaccination 09/21/2019, 10/11/2019, 04/30/2020, 11/01/2020, 04/25/2022   Pfizer Covid-19 Vaccine Bivalent Booster 20yrs & up 04/23/2021   Pneumococcal Conjugate-13 09/20/2020   Pneumococcal Polysaccharide-23 02/28/2019    TDAP status: Due, Education has been provided regarding the importance of this vaccine. Advised may receive this vaccine at local pharmacy or Health Dept. Aware to provide a copy of the vaccination record if obtained from local pharmacy or Health Dept. Verbalized acceptance and understanding.  Flu Vaccine status: Completed at today's visit  Pneumococcal vaccine status: Up to date  Covid-19 vaccine status: Completed vaccines  Qualifies for Shingles Vaccine? Yes   Zostavax completed No   Shingrix Completed?: No.    Education has been provided regarding the importance of this vaccine. Patient has been advised to call insurance company to determine out of pocket expense if they have not yet received this vaccine. Advised may also receive vaccine at local pharmacy or Health Dept. Verbalized acceptance and understanding.  Screening Tests Health Maintenance  Topic Date Due   Zoster Vaccines- Shingrix (1 of 2) Never done   TETANUS/TDAP  10/06/2022 (Originally 04/23/1975)   COVID-19 Vaccine (6 - Pfizer series) 08/26/2022   MAMMOGRAM  10/28/2022   Medicare Annual Wellness (AWV)  06/16/2023   Pneumonia Vaccine 43+ Years old (3 - PPSV23 or PCV20) 02/28/2024   COLONOSCOPY (Pts 45-44yrs Insurance coverage will need to be confirmed)  11/23/2026   INFLUENZA VACCINE  Completed   DEXA SCAN  Completed   Hepatitis C Screening  Completed   HPV VACCINES  Aged Out    Health Maintenance  Health Maintenance Due   Topic  Date Due   Zoster Vaccines- Shingrix (1 of 2) Never done    Colorectal cancer screening: Type of screening: Colonoscopy. Completed 11/22/2016. Repeat every 10 years  Mammogram status: Completed 10/27/2021. Repeat every year  Bone Density status: Completed 09/12/2020. Results reflect: Bone density results: OSTEOPENIA. Repeat every 2 years.  Lung Cancer Screening: (Low Dose CT Chest recommended if Age 63-80 years, 30 pack-year currently smoking OR have quit w/in 15years.) does not qualify.   Lung Cancer Screening Referral: N/A  Additional Screening:  Hepatitis C Screening: does qualify; Completed 07/05/2020  Vision Screening: Recommended annual ophthalmology exams for early detection of glaucoma and other disorders of the eye. Is the patient up to date with their annual eye exam?  Yes  Who is the provider or what is the name of the office in which the patient attends annual eye exams? [facility] If pt is not established with a provider, would they like to be referred to a provider to establish care?  N/A  .   Dental Screening: Recommended annual dental exams for proper oral hygiene  Community Resource Referral / Chronic Care Management: CRR required this visit?  No   CCM required this visit?  No      Plan:     I have personally reviewed and noted the following in the patient's chart:   Medical and social history Use of alcohol, tobacco or illicit drugs  Current medications and supplements including opioid prescriptions. Patient is not currently taking opioid prescriptions. Functional ability and status Nutritional status Physical activity Advanced directives List of other physicians Hospitalizations, surgeries, and ER visits in previous 12 months Vitals Screenings to include cognitive, depression, and falls Referrals and appointments  In addition, I have reviewed and discussed with patient certain preventive protocols, quality metrics, and best  practice recommendations. A written personalized care plan for preventive services as well as general preventive health recommendations were provided to patient.     Clista Bernhardt, [place]   05/16/2022    Ms. Gass , Thank you for taking time to come for your Medicare Wellness Visit. I appreciate your ongoing commitment to your health goals. Please review the following plan we discussed and let me know if I can assist you in the future.   These are the goals we discussed:  Goals      DIET - INCREASE WATER INTAKE     Recommend drinking 6-8 glasses of water per day         This is a list of the screening recommended for you and due dates:  Health Maintenance  Topic Date Due   Zoster (Shingles) Vaccine (1 of 2) Never done   Tetanus Vaccine  10/06/2022*   COVID-19 Vaccine (6 - Pfizer series) 08/26/2022   Mammogram  10/28/2022   Medicare Annual Wellness Visit  06/16/2023   Pneumonia Vaccine (3 - PPSV23 or PCV20) 02/28/2024   Colon Cancer Screening  11/23/2026   Flu Shot  Completed   DEXA scan (bone density measurement)  Completed   Hepatitis C Screening: USPSTF Recommendation to screen - Ages 75-79 yo.  Completed   HPV Vaccine  Aged Out  *Topic was postponed. The date shown is not the original due date.     Nurse Notes: None.

## 2022-05-16 ENCOUNTER — Ambulatory Visit (INDEPENDENT_AMBULATORY_CARE_PROVIDER_SITE_OTHER): Payer: Medicare Other

## 2022-05-16 VITALS — Ht 62.0 in | Wt 146.0 lb

## 2022-05-16 DIAGNOSIS — Z0000 Encounter for general adult medical examination without abnormal findings: Secondary | ICD-10-CM

## 2022-07-11 NOTE — Assessment & Plan Note (Signed)
Started on low dose beta blocker in June 2023

## 2022-07-11 NOTE — Assessment & Plan Note (Signed)
On Prolia since 02/2021

## 2022-07-11 NOTE — Assessment & Plan Note (Signed)
On omeprazole for several years Symptoms well controlled on daily PPI No red flag signs such as weight loss, n/v, melena

## 2022-07-11 NOTE — Progress Notes (Unsigned)
Date:  07/12/2022   Name:  Roberta Ward   DOB:  09/21/1955   MRN:  [id number]   Chief Complaint: No chief complaint on file. Roberta Ward is a 66 y.o. female who presents today for her Complete Annual Exam. She feels {DESC; WELL/FAIRLY WELL/POORLY:18703}. She reports exercising ***. She reports she is sleeping {DESC; WELL/FAIRLY WELL/POORLY:18703}. Breast complaints ***.  Mammogram: 10/2021 DEXA: 08/2020 osteoporosis Pap smear: discontinued Colonoscopy: 11/2016 repeat 10 yrs  Health Maintenance Due  Topic Date Due   DTaP/Tdap/Td (1 - Tdap) Never done   Zoster Vaccines- Shingrix (1 of 2) Never done   COVID-19 Vaccine (7 - 2023-24 season) 06/20/2022    Immunization History  Administered Date(s) Administered   Fluad Quad(high Dose 65+) 07/05/2020   Influenza, High Dose Seasonal PF 04/23/2021   Influenza-Unspecified 04/25/2022   PFIZER(Purple Top)SARS-COV-2 Vaccination 09/21/2019, 10/11/2019, 04/30/2020, 11/01/2020, 04/25/2022   Pfizer Covid-19 Vaccine Bivalent Booster 24yr & up 04/23/2021   Pneumococcal Conjugate-13 09/20/2020   Pneumococcal Polysaccharide-23 02/28/2019    Gastroesophageal Reflux She complains of heartburn. She reports no abdominal pain, no chest pain, no coughing or no wheezing. This is a recurrent problem. The problem has been unchanged. Pertinent negatives include no fatigue. She has tried a PPI for the symptoms.  Tremor - started beta blocker in June.  Lab Results  Component Value Date   NA 141 07/10/2021   K 4.4 07/10/2021   CO2 21 07/10/2021   GLUCOSE 102 (H) 07/10/2021   BUN 14 07/10/2021   CREATININE 0.69 07/10/2021   CALCIUM 9.1 07/10/2021   EGFR 95 07/10/2021   GFRNONAA 86 07/05/2020   Lab Results  Component Value Date   CHOL 199 07/10/2021   HDL 64 07/10/2021   LDLCALC 119 (H) 07/10/2021   TRIG 87 07/10/2021   CHOLHDL 3.1 07/10/2021   Lab Results  Component Value Date   TSH 1.990 02/03/2021   Lab Results  Component Value  Date   HGBA1C 5.7 (H) 07/10/2021   Lab Results  Component Value Date   WBC 6.3 06/02/2021   HGB 15.2 06/02/2021   HCT 45.4 06/02/2021   MCV 96 06/02/2021   PLT 287 06/02/2021   Lab Results  Component Value Date   ALT 30 07/10/2021   AST 25 07/10/2021   ALKPHOS 54 07/10/2021   BILITOT 0.4 07/10/2021   Lab Results  Component Value Date   VD25OH 32.1 07/10/2021     Review of Systems  Constitutional:  Negative for chills, fatigue and fever.  HENT:  Negative for congestion, hearing loss, tinnitus, trouble swallowing and voice change.   Eyes:  Negative for visual disturbance.  Respiratory:  Negative for cough, chest tightness, shortness of breath and wheezing.   Cardiovascular:  Negative for chest pain, palpitations and leg swelling.  Gastrointestinal:  Positive for heartburn. Negative for abdominal pain, constipation, diarrhea and vomiting.  Endocrine: Negative for polydipsia and polyuria.  Genitourinary:  Negative for dysuria, frequency, genital sores, vaginal bleeding and vaginal discharge.  Musculoskeletal:  Negative for arthralgias, gait problem and joint swelling.  Skin:  Negative for color change and rash.  Neurological:  Negative for dizziness, tremors, light-headedness and headaches.  Hematological:  Negative for adenopathy. Does not bruise/bleed easily.  Psychiatric/Behavioral:  Negative for dysphoric mood and sleep disturbance. The patient is not nervous/anxious.     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[place]) 02/03/2021   Polyarthralgia 02/03/2021   Gastroesophageal reflux disease 09/20/2020   Mild hyperlipidemia 07/06/2020   Prediabetes 04/01/2020   Closed compression fracture of L2 lumbar vertebra, initial encounter ([place]) 03/02/2020   Age-related osteoporosis with current pathological  fracture 03/01/2020   Primary insomnia 03/01/2020   Left foot pain 03/01/2020   Primary osteoarthritis of left knee 03/01/2020   Lumbar back pain 03/01/2020   Patient is Jehovah's Witness 03/01/2020   Colon polyp 08/14/2016   Vitamin D deficiency 01/25/2016    Allergies  Allergen Reactions   Sulfa Antibiotics Hives and Itching   Latex    Talc     Other reaction(s): Angioedema    Past Surgical History:  Procedure Laterality Date   ABDOMINAL HYSTERECTOMY     KNEE CARTILAGE SURGERY Left    PARTIAL HYSTERECTOMY  1980   Rt ovary still present and cervix still present.   TONSILECTOMY/ADENOIDECTOMY WITH MYRINGOTOMY      Social History   Tobacco Use   Smoking status: Never   Smokeless tobacco: Never  Vaping Use   Vaping Use: Never used  Substance Use Topics   Alcohol use: Yes    Comment: rare- occasional   Drug use: Never     Medication list has been reviewed and updated.  No outpatient medications have been marked as taking for the 07/12/22 encounter (Appointment) with Glean Hess, MD.       01/10/2022    1:54 PM 10/05/2021   11:37 AM 07/10/2021    8:02 AM 02/03/2021    2:18 PM  GAD 7 : Generalized Anxiety Score  Nervous, Anxious, on Edge 0 0 0 3  Control/stop worrying 0 0 0 2  Worry too much - different things 0 0 0 2  Trouble relaxing 0 0 0 2  Restless 0 0 0 0  Easily annoyed or irritable 0 0 0 0  Afraid - awful might happen 0 0 0 0  Total GAD 7 Score 0 0 0 9  Anxiety Difficulty Not difficult at all Not difficult at all Not difficult at all Not difficult at all       05/16/2022    8:19 AM 01/10/2022    1:54 PM 10/05/2021   11:37 AM  Depression screen PHQ 2/9  Decreased Interest 0 0 0  Down, Depressed, Hopeless 0 0 0  PHQ - 2 Score 0 0 0  Altered sleeping _0 Tired, decreased energy 2 0 2  Change in appetite 0 0 0  Feeling bad or failure about yourself  0 0 0  Trouble concentrating 0 0 0  Moving slowly or fidgety/restless 0 0 0  Suicidal  thoughts 0 0 0  PHQ-9 Score _1 Difficult doing work/chores Not difficult at all Not difficult at all Not difficult at all    BP Readings from Last 3 Encounters:  01/10/22 (!) 142/82  10/05/21 108/76  07/10/21 134/72    Physical Exam Vitals and nursing note reviewed.  Constitutional:      General: She is not in acute distress.    Appearance: She is well-developed.  HENT:     Head: Normocephalic and atraumatic.     Right Ear: Tympanic membrane and ear canal normal.     Left Ear: Tympanic membrane and ear canal normal.     Nose:     Right Sinus: No maxillary sinus tenderness.     Left Sinus: No maxillary sinus tenderness.  Eyes:     General: No scleral icterus.       Right eye: No discharge.        Left eye: No discharge.     Conjunctiva/sclera: Conjunctivae normal.  Neck:     Thyroid: No thyromegaly.     Vascular: No carotid bruit.  Cardiovascular:     Rate and Rhythm: Normal rate and regular rhythm.     Pulses: Normal pulses.     Heart sounds: Normal heart sounds.  Pulmonary:     Effort: Pulmonary effort is normal. No respiratory distress.     Breath sounds: No wheezing.  Chest:  Breasts:    Right: No mass, nipple discharge, skin change or tenderness.     Left: No mass, nipple discharge, skin change or tenderness.  Abdominal:     General: Bowel sounds are normal.     Palpations: Abdomen is soft.     Tenderness: There is no abdominal tenderness.  Musculoskeletal:     Cervical back: Normal range of motion. No erythema.     Right lower leg: No edema.     Left lower leg: No edema.  Lymphadenopathy:     Cervical: No cervical adenopathy.  Skin:    General: Skin is warm and dry.     Findings: No rash.  Neurological:     Mental Status: She is alert and oriented to person, place, and time.     Cranial Nerves: No cranial nerve deficit.     Sensory: No sensory deficit.     Deep Tendon Reflexes: Reflexes are normal and symmetric.  Psychiatric:        Attention and  Perception: Attention normal.        Mood and Affect: Mood normal.     Wt Readings from Last 3 Encounters:  05/15/22 146 lb (66.2 kg)  01/10/22 146 lb (66.2 kg)  10/05/21 143 lb (64.9 kg)    There were no vitals taken for this visit.  Assessment and Plan:

## 2022-07-11 NOTE — Assessment & Plan Note (Signed)
A1C in 06/2021 was 5.7 - low carb diet recommended

## 2022-07-12 ENCOUNTER — Encounter: Payer: Self-pay | Admitting: Internal Medicine

## 2022-07-12 ENCOUNTER — Ambulatory Visit (INDEPENDENT_AMBULATORY_CARE_PROVIDER_SITE_OTHER): Payer: Medicare Other | Admitting: Internal Medicine

## 2022-07-12 VITALS — BP 128/76 | HR 54 | Ht 62.0 in | Wt 139.4 lb

## 2022-07-12 DIAGNOSIS — K219 Gastro-esophageal reflux disease without esophagitis: Secondary | ICD-10-CM | POA: Diagnosis not present

## 2022-07-12 DIAGNOSIS — R251 Tremor, unspecified: Secondary | ICD-10-CM

## 2022-07-12 DIAGNOSIS — M8000XD Age-related osteoporosis with current pathological fracture, unspecified site, subsequent encounter for fracture with routine healing: Secondary | ICD-10-CM | POA: Diagnosis not present

## 2022-07-12 DIAGNOSIS — E785 Hyperlipidemia, unspecified: Secondary | ICD-10-CM

## 2022-07-12 DIAGNOSIS — G2581 Restless legs syndrome: Secondary | ICD-10-CM

## 2022-07-12 DIAGNOSIS — M2550 Pain in unspecified joint: Secondary | ICD-10-CM

## 2022-07-12 DIAGNOSIS — Z1231 Encounter for screening mammogram for malignant neoplasm of breast: Secondary | ICD-10-CM

## 2022-07-12 DIAGNOSIS — R7303 Prediabetes: Secondary | ICD-10-CM | POA: Diagnosis not present

## 2022-07-12 MED ORDER — ROPINIROLE HCL 0.25 MG PO TABS: 0.5000 mg | ORAL_TABLET | Freq: Every day | ORAL | 0 refills | Status: DC

## 2022-07-12 MED ORDER — OMEPRAZOLE 20 MG PO CPDR: 20.0000 mg | DELAYED_RELEASE_CAPSULE | Freq: Every day | ORAL | 1 refills | Status: DC

## 2022-07-12 MED ORDER — DULOXETINE HCL 30 MG PO CPEP: 30.0000 mg | ORAL_CAPSULE | Freq: Every day | ORAL | 0 refills | Status: DC

## 2022-07-12 NOTE — Assessment & Plan Note (Signed)
Started Cymbalta 30 mg for pain and mood disorder Will follow up in 6 weeks

## 2022-07-12 NOTE — Assessment & Plan Note (Signed)
Requip increased to 0.5 mg nightly

## 2022-07-12 NOTE — Patient Instructions (Signed)
Call [facility] to schedule your mammogram at [phone].  

## 2022-07-13 LAB — HEMOGLOBIN A1C
Est. average glucose Bld gHb Est-mCnc: 126 mg/dL
Hgb A1c MFr Bld: 6 % — ABNORMAL HIGH (ref 4.8–5.6)

## 2022-07-13 LAB — CBC WITH DIFFERENTIAL/PLATELET
Basophils Absolute: 0 10*3/uL (ref 0.0–0.2)
Basos: 1 %
EOS (ABSOLUTE): 0.3 10*3/uL (ref 0.0–0.4)
Eos: 4 %
Hematocrit: 43.3 % (ref 34.0–46.6)
Hemoglobin: 14.5 g/dL (ref 11.1–15.9)
Immature Grans (Abs): 0 10*3/uL (ref 0.0–0.1)
Immature Granulocytes: 0 %
Lymphocytes Absolute: 1.9 10*3/uL (ref 0.7–3.1)
Lymphs: 30 %
MCH: 32.8 pg (ref 26.6–33.0)
MCHC: 33.5 g/dL (ref 31.5–35.7)
MCV: 98 fL — ABNORMAL HIGH (ref 79–97)
Monocytes Absolute: 0.6 10*3/uL (ref 0.1–0.9)
Monocytes: 9 %
Neutrophils Absolute: 3.6 10*3/uL (ref 1.4–7.0)
Neutrophils: 56 %
Platelets: 249 10*3/uL (ref 150–450)
RBC: 4.42 x10E6/uL (ref 3.77–5.28)
RDW: 12.4 % (ref 11.7–15.4)
WBC: 6.3 10*3/uL (ref 3.4–10.8)

## 2022-07-13 LAB — COMPREHENSIVE METABOLIC PANEL
ALT: 36 IU/L — ABNORMAL HIGH (ref 0–32)
AST: 35 IU/L (ref 0–40)
Albumin/Globulin Ratio: 2.1 (ref 1.2–2.2)
Albumin: 4.7 g/dL (ref 3.9–4.9)
Alkaline Phosphatase: 54 IU/L (ref 44–121)
BUN/Creatinine Ratio: 25 (ref 12–28)
BUN: 17 mg/dL (ref 8–27)
Bilirubin Total: 0.4 mg/dL (ref 0.0–1.2)
CO2: 25 mmol/L (ref 20–29)
Calcium: 9.7 mg/dL (ref 8.7–10.3)
Chloride: 103 mmol/L (ref 96–106)
Creatinine, Ser: 0.68 mg/dL (ref 0.57–1.00)
Globulin, Total: 2.2 g/dL (ref 1.5–4.5)
Glucose: 103 mg/dL — ABNORMAL HIGH (ref 70–99)
Potassium: 4.8 mmol/L (ref 3.5–5.2)
Sodium: 141 mmol/L (ref 134–144)
Total Protein: 6.9 g/dL (ref 6.0–8.5)
eGFR: 95 mL/min/{1.73_m2} (ref >59)

## 2022-07-13 LAB — TSH: TSH: 2.38 u[IU]/mL (ref 0.450–4.500)

## 2022-07-13 LAB — LIPID PANEL
Chol/HDL Ratio: 4 ratio (ref 0.0–4.4)
Cholesterol, Total: 219 mg/dL — ABNORMAL HIGH (ref 100–199)
HDL: 55 mg/dL (ref >39)
LDL Chol Calc (NIH): 141 mg/dL — ABNORMAL HIGH (ref 0–99)
Triglycerides: 129 mg/dL (ref 0–149)
VLDL Cholesterol Cal: 23 mg/dL (ref 5–40)

## 2022-08-28 ENCOUNTER — Ambulatory Visit: Payer: Medicare Other | Admitting: Internal Medicine

## 2022-08-29 ENCOUNTER — Ambulatory Visit (INDEPENDENT_AMBULATORY_CARE_PROVIDER_SITE_OTHER): Payer: Medicare Other | Admitting: Internal Medicine

## 2022-08-29 ENCOUNTER — Encounter: Payer: Self-pay | Admitting: Internal Medicine

## 2022-08-29 VITALS — BP 148/92 | HR 84 | Ht 62.0 in | Wt 139.0 lb

## 2022-08-29 DIAGNOSIS — M2550 Pain in unspecified joint: Secondary | ICD-10-CM

## 2022-08-29 DIAGNOSIS — I10 Essential (primary) hypertension: Secondary | ICD-10-CM

## 2022-08-29 DIAGNOSIS — G2581 Restless legs syndrome: Secondary | ICD-10-CM

## 2022-08-29 DIAGNOSIS — R251 Tremor, unspecified: Secondary | ICD-10-CM

## 2022-08-29 MED ORDER — ATENOLOL 25 MG PO TABS: 25.0000 mg | ORAL_TABLET | Freq: Every day | ORAL | 3 refills | Status: DC

## 2022-08-29 MED ORDER — CLOBETASOL PROPIONATE 0.05 % EX CREA: 1.0000 | TOPICAL_CREAM | Freq: Two times a day (BID) | CUTANEOUS | 1 refills | Status: DC

## 2022-08-29 MED ORDER — DULOXETINE HCL 30 MG PO CPEP: 30.0000 mg | ORAL_CAPSULE | Freq: Every day | ORAL | 3 refills | Status: DC

## 2022-08-29 NOTE — Assessment & Plan Note (Signed)
Will resume atenolol Refer to Neurology

## 2022-08-29 NOTE — Patient Instructions (Addendum)
Check bp at home 1-2 times per week.  Goal BP is less than 130/80  Call [facility] to schedule your mammogram at [phone].

## 2022-08-29 NOTE — Assessment & Plan Note (Addendum)
Gradual onset over past few years. Was on Atenolol for tremor but ran out. Unsure if it is helpful for tremor. She will resume atenolol and monitor BP at home

## 2022-08-29 NOTE — Progress Notes (Signed)
Date:  08/29/2022   Name:  Roberta Ward   DOB:  1956/01/17   MRN:  [id number]   Chief Complaint: Tremors (Feeling better but producing an excessive amount of phlegm in throat. )  HPI Tremor - she was on atenolol but ran out.  Not sure if it helped her tremor.  She does not check BP and has not had an official dx.  Myalgia - the cymbalta has been helpful to reduce her overall body pain and discomfort.  She thinks that one of her medications is causing phlegm buildup in her throat.   Lab Results  Component Value Date   NA 141 07/12/2022   K 4.8 07/12/2022   CO2 25 07/12/2022   GLUCOSE 103 (H) 07/12/2022   BUN 17 07/12/2022   CREATININE 0.68 07/12/2022   CALCIUM 9.7 07/12/2022   EGFR 95 07/12/2022   GFRNONAA 86 07/05/2020   Lab Results  Component Value Date   CHOL 219 (H) 07/12/2022   HDL 55 07/12/2022   LDLCALC 141 (H) 07/12/2022   TRIG 129 07/12/2022   CHOLHDL 4.0 07/12/2022   Lab Results  Component Value Date   TSH 2.380 07/12/2022   Lab Results  Component Value Date   HGBA1C 6.0 (H) 07/12/2022   Lab Results  Component Value Date   WBC 6.3 07/12/2022   HGB 14.5 07/12/2022   HCT 43.3 07/12/2022   MCV 98 (H) 07/12/2022   PLT 249 07/12/2022   Lab Results  Component Value Date   ALT 36 (H) 07/12/2022   AST 35 07/12/2022   ALKPHOS 54 07/12/2022   BILITOT 0.4 07/12/2022   Lab Results  Component Value Date   VD25OH 32.1 07/10/2021     Review of Systems  Constitutional:  Negative for chills, fatigue and fever.  HENT:  Negative for sinus pressure, sore throat and trouble swallowing.   Respiratory:  Negative for cough, chest tightness, shortness of breath and wheezing.   Gastrointestinal:  Negative for abdominal distention.  Musculoskeletal:  Positive for arthralgias. Negative for myalgias.  Psychiatric/Behavioral:  Negative for dysphoric mood and sleep disturbance. The patient is nervous/anxious.     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[place]) 02/03/2021   Polyarthralgia 02/03/2021   Gastroesophageal reflux disease 09/20/2020   Mild hyperlipidemia 07/06/2020   Prediabetes 04/01/2020   Closed compression fracture of L2 lumbar vertebra, initial encounter ([place]) 03/02/2020   Age-related osteoporosis with current pathological fracture 03/01/2020   Primary insomnia 03/01/2020   Left foot pain 03/01/2020   Primary osteoarthritis of left knee 03/01/2020   Lumbar back pain 03/01/2020   Patient is Jehovah's Witness 03/01/2020   Colon polyp 08/14/2016   Vitamin D deficiency 01/25/2016    Allergies  Allergen Reactions   Sulfa Antibiotics Hives and Itching   Latex    Talc     Other reaction(s): Angioedema    Past Surgical History:  Procedure Laterality Date   ABDOMINAL HYSTERECTOMY     KNEE CARTILAGE SURGERY Left    PARTIAL HYSTERECTOMY  1980   Rt ovary still present and cervix still present.   TONSILECTOMY/ADENOIDECTOMY WITH MYRINGOTOMY      Social History   Tobacco Use   Smoking status: Never   Smokeless tobacco: Never  Vaping Use   Vaping Use: Never used  Substance Use Topics   Alcohol use: Yes    Comment: rare- occasional   Drug use: Never     Medication list has been reviewed and updated.  Current Meds  Medication Sig   Ascorbic Acid (VITAMIN C) 1000 MG tablet Take 1,000 mg by mouth daily.   Cholecalciferol (VITAMIN D) 50 MCG (2000 UT) CAPS Take by mouth.   denosumab (PROLIA) 60 MG/ML SOSY injection Inject into the skin. q6 months   magnesium oxide (MAG-OX) 400 MG tablet Take by mouth.   Melatonin 10 MG CAPS Take by mouth.   omeprazole (PRILOSEC) 20 MG capsule Take 1 capsule (20 mg total) by mouth daily.   rOPINIRole (REQUIP) 0.25 MG  tablet Take 2 tablets (0.5 mg total) by mouth at bedtime.   [DISCONTINUED] clobetasol cream (TEMOVATE) 2.53 % Apply 1 Application topically 2 (two) times daily.   [DISCONTINUED] DULoxetine (CYMBALTA) 30 MG capsule Take 1 capsule (30 mg total) by mouth daily.       08/29/2022    9:50 AM 07/12/2022    8:06 AM 01/10/2022    1:54 PM 10/05/2021   11:37 AM  GAD 7 : Generalized Anxiety Score  Nervous, Anxious, on Edge 0 0 0 0  Control/stop worrying 0 0 0 0  Worry too much - different things 0 3 0 0  Trouble relaxing 0 3 0 0  Restless 0 2 0 0  Easily annoyed or irritable 0 0 0 0  Afraid - awful might happen 0 1 0 0  Total GAD 7 Score 0 9 0 0  Anxiety Difficulty Not difficult at all Very difficult Not difficult at all Not difficult at all       08/29/2022    9:50 AM 07/12/2022    8:06 AM 05/16/2022    8:19 AM  Depression screen PHQ 2/9  Decreased Interest 0 0 0  Down, Depressed, Hopeless 0 0 0  PHQ - 2 Score 0 0 0  Altered sleeping 3 3 2   Tired, decreased energy 2 2 2   Change in appetite 0 0 0  Feeling bad or failure about yourself  0 0 0  Trouble concentrating 0 0 0  Moving slowly or fidgety/restless 0 0 0  Suicidal thoughts 0 0 0  PHQ-9 Score 5 5 4   Difficult doing work/chores Not difficult at all Very difficult Not difficult at all    BP Readings from Last 3 Encounters:  08/29/22 (!) 148/92  07/12/22 128/76  01/10/22 (!) 142/82    Physical Exam Vitals and nursing note reviewed.  Constitutional:      General: She is not in acute distress.    Appearance: Normal appearance. She is well-developed.  HENT:     Head: Normocephalic and atraumatic.  Cardiovascular:     Rate and Rhythm: Normal rate and regular rhythm.     Pulses: Normal pulses.  Pulmonary:     Effort: Pulmonary effort is normal. No respiratory distress.     Breath sounds: No wheezing or rhonchi.  Musculoskeletal:     Cervical back: Normal range of motion.     Right lower leg: No edema.     Left lower leg:  No edema.  Lymphadenopathy:     Cervical: No cervical adenopathy.  Skin:    General: Skin is warm and dry.     Findings: No rash.  Neurological:     Mental Status: She is alert and oriented to person, place, and  time.     Motor: Tremor present.  Psychiatric:        Mood and Affect: Mood normal.        Behavior: Behavior normal.     Wt Readings from Last 3 Encounters:  08/29/22 139 lb (63 kg)  07/12/22 139 lb 6.4 oz (63.2 kg)  05/15/22 146 lb (66.2 kg)    BP (!) 148/92   Pulse 84   Ht 5\' 2"  (1.575 m)   Wt 139 lb (63 kg)   SpO2 97%   BMI 25.42 kg/m   Assessment and Plan: Problem List Items Addressed This Visit       Cardiovascular and Mediastinum   Essential hypertension - Primary    Gradual onset over past few years. Was on Atenolol for tremor but ran out. Unsure if it is helpful for tremor. She will resume atenolol and monitor BP at home      Relevant Medications   atenolol (TENORMIN) 25 MG tablet     Other   Polyarthralgia    Improved on Cymbalta without significant side effects      Relevant Medications   DULoxetine (CYMBALTA) 30 MG capsule   Restless legs syndrome (RLS) (Chronic)   Relevant Orders   Ambulatory referral to Neurology   Tremor (Chronic)    Will resume atenolol Refer to Neurology      Relevant Medications   atenolol (TENORMIN) 25 MG tablet   Other Relevant Orders   Ambulatory referral to Neurology     Partially dictated using [place]. Any errors are unintentional.  Halina Maidens, MD [facility]  08/29/2022

## 2022-08-29 NOTE — Assessment & Plan Note (Signed)
Improved on Cymbalta without significant side effects

## 2022-09-10 ENCOUNTER — Emergency Department: Payer: Medicare Other

## 2022-09-10 ENCOUNTER — Ambulatory Visit: Payer: Self-pay

## 2022-09-10 ENCOUNTER — Emergency Department
Admission: EM | Admit: 2022-09-10 | Discharge: 2022-09-10 | Disposition: A | Discharge status: Home / Self Care | Payer: Medicare Other | Attending: Emergency Medicine | Admitting: Emergency Medicine

## 2022-09-10 DIAGNOSIS — I10 Essential (primary) hypertension: Secondary | ICD-10-CM | POA: Diagnosis not present

## 2022-09-10 DIAGNOSIS — R079 Chest pain, unspecified: Secondary | ICD-10-CM | POA: Insufficient documentation

## 2022-09-10 LAB — CBC
HCT: 44.4 % (ref 36.0–46.0)
Hemoglobin: 14.4 g/dL (ref 12.0–15.0)
MCH: 32.3 pg (ref 26.0–34.0)
MCHC: 32.4 g/dL (ref 30.0–36.0)
MCV: 99.6 fL (ref 80.0–100.0)
Platelets: 279 10*3/uL (ref 150–400)
RBC: 4.46 MIL/uL (ref 3.87–5.11)
RDW: 12.8 % (ref 11.5–15.5)
WBC: 8.2 10*3/uL (ref 4.0–10.5)
nRBC: 0 % (ref 0.0–0.2)

## 2022-09-10 LAB — BASIC METABOLIC PANEL
Anion gap: 8 (ref 5–15)
BUN: 19 mg/dL (ref 8–23)
CO2: 25 mmol/L (ref 22–32)
Calcium: 9.2 mg/dL (ref 8.9–10.3)
Chloride: 104 mmol/L (ref 98–111)
Creatinine, Ser: 0.61 mg/dL (ref 0.44–1.00)
GFR, Estimated: >60 mL/min (ref >60)
Glucose, Bld: 93 mg/dL (ref 70–99)
Potassium: 4.4 mmol/L (ref 3.5–5.1)
Sodium: 137 mmol/L (ref 135–145)

## 2022-09-10 LAB — D-DIMER, QUANTITATIVE: D-Dimer, Quant: 0.43 ug/mL-FEU (ref 0.00–0.50)

## 2022-09-10 LAB — TROPONIN I (HIGH SENSITIVITY)
Troponin I (High Sensitivity): 2 ng/L (ref <18)
Troponin I (High Sensitivity): 3 ng/L (ref <18)

## 2022-09-10 MED ORDER — ASPIRIN 81 MG PO CHEW
324.0000 mg | CHEWABLE_TABLET | Freq: Once | ORAL | Status: AC
  Administered 2022-09-10: 324 mg via ORAL
  Filled 2022-09-10: qty 4

## 2022-09-10 MED ORDER — KETOROLAC TROMETHAMINE 15 MG/ML IJ SOLN
15.0000 mg | Freq: Once | INTRAMUSCULAR | Status: AC
  Administered 2022-09-10: 15 mg via INTRAVENOUS
  Filled 2022-09-10: qty 1

## 2022-09-10 NOTE — Discharge Instructions (Addendum)
You were seen in the emergency department for chest pain.  You had heart enzymes that were normal, you are not having a heart attack today.  You do not have a blood clot in your lungs.  You had no signs of pneumonia.  Your blood pressure was mildly elevated in the emergency department.  It is importantly continue to take your blood pressure medication as prescribed, call and schedule close appointment with your primary care physician so they can recheck your blood pressure and see if you need to have any adjustments of your blood pressure medications.  It is importantly follow-up with your primary care physician for further workup of your chest pain.  Return to the emergency department if you have worsening chest pain or shortness of breath.  Pain control:  Ibuprofen (motrin/aleve/advil) - You can take 3-4 tablets (600-800 mg) every 6 hours as needed for pain/fever.  Acetaminophen (tylenol) - You can take 2 extra strength tablets (1000 mg) every 6 hours as needed for pain/fever.  You can alternate these medications or take them together.  Make sure you eat food/drink water when taking these medications.

## 2022-09-10 NOTE — ED Triage Notes (Signed)
Pt presents to the ED via POV due to CP that started this morning. Pt states the pain is intermittent. Pt denies NVD. Pt A&Ox4

## 2022-09-10 NOTE — ED Provider Notes (Signed)
[facility] Provider Note    Event Date/Time   First MD Initiated Contact with Patient 09/10/22 1729     (approximate)   History   Chest Pain   HPI  Roberta Ward is a 67 y.o. female past medical history for hypertension who presents to the emergency department for chest pain.  Chest pain that started this morning at 1030.  Describes a pressure sensation.  Denies significant shortness of breath.  Started having pain to her left lower extremity.  Denies any significant swelling to her leg.  Denies any cough or shortness of breath.  Prior stress testing that was many years ago.  Does take blood pressure medication that was recently started.  No recent trip or travel.  No history of PE or DVT.  No tobacco use.  No alcohol use.     Physical Exam   Triage Vital Signs: ED Triage Vitals  Enc Vitals Group     BP 09/10/22 1347 (!) 151/68     Pulse Rate 09/10/22 1347 (!) 59     Resp 09/10/22 1347 16     Temp 09/10/22 1347 97.8 F (36.6 C)     Temp Source 09/10/22 1347 Oral     SpO2 09/10/22 1347 96 %     Weight 09/10/22 1339 139 lb (63 kg)     Height 09/10/22 1339 5' 2"$  (1.575 m)     Head Circumference --      Peak Flow --      Pain Score 09/10/22 1339 4     Pain Loc --      Pain Edu? --      Excl. in [place]? --     Most recent vital signs: Vitals:   09/10/22 1800 09/10/22 1815  BP: (!) 167/77   Pulse: 61 (!) 56  Resp: 17 20  Temp:    SpO2: 100% 99%    Physical Exam Constitutional:      Appearance: She is well-developed.  HENT:     Head: Atraumatic.  Eyes:     Conjunctiva/sclera: Conjunctivae normal.  Cardiovascular:     Rate and Rhythm: Regular rhythm.  Pulmonary:     Effort: No respiratory distress.  Abdominal:     General: There is no distension.  Musculoskeletal:        General: Normal range of motion.     Cervical back: Normal range of motion.     Right lower leg: No edema.     Left lower leg: No edema.  Skin:    General: Skin  is warm.     Capillary Refill: Capillary refill takes less than 2 seconds.  Neurological:     Mental Status: She is alert. Mental status is at baseline.     IMPRESSION / MDM / ASSESSMENT AND PLAN / ED COURSE  I reviewed the triage vital signs and the nursing notes.  Differential diagnosis including ACS, pneumonia, anemia, pulmonary embolism.  Low suspicion for dissection.  Possibly gastritis or symptomatic cholelithiasis.  EKG  I, Nathaniel Man, the attending physician, personally viewed and interpreted this ECG.   Rate: Normal  Rhythm: Normal sinus  Axis: Normal  Intervals: Normal  ST&T Change: None  No tachycardic or bradycardic dysrhythmias while on cardiac telemetry.  RADIOLOGY I independently reviewed imaging, my interpretation of imaging: Chest x-ray no findings consistent with pneumonia  LABS (all labs ordered are listed, but only abnormal results are displayed) Labs interpreted as -  Initial troponin is negative.  No  significant anemia.  Will add on a D-dimer to further evaluate for pulmonary embolism.  Labs Reviewed  BASIC METABOLIC PANEL  CBC  D-DIMER, QUANTITATIVE  TROPONIN I (HIGH SENSITIVITY)  TROPONIN I (HIGH SENSITIVITY)    TREATMENT    MDM  D-dimer is negative.  Low risk Wells criteria no concern for pulmonary embolism.  No significant anemia.  On repeat exam no worsening chest pain.  Serial troponins are undetectable.  Clinical picture is not consistent with ACS.  No concerning symptoms concerning for dissection, no abdominal tenderness to palpation have a low suspicion for gallbladder etiology or peptic ulcer disease.  Discussed symptomatic treatment and given strict return precautions to the emergency department.  Discussed close follow-up with primary care physician.     PROCEDURES:  Critical Care performed: No  Procedures  Patient's presentation is most consistent with acute presentation with potential threat to life or bodily  function.   MEDICATIONS ORDERED IN ED: Medications  aspirin chewable tablet 324 mg (324 mg Oral Given 09/10/22 1812)  ketorolac (TORADOL) 15 MG/ML injection 15 mg (15 mg Intravenous Given 09/10/22 1852)    FINAL CLINICAL IMPRESSION(S) / ED DIAGNOSES   Final diagnoses:  Chest pain, unspecified type     Rx / DC Orders   ED Discharge Orders     None        Note:  This document was prepared using Dragon voice recognition software and may include unintentional dictation errors.   Nathaniel Man, MD 09/10/22 [phone]

## 2022-09-10 NOTE — Telephone Encounter (Signed)
Chief Complaint: Chest pain Symptoms: 8/10 in center of chest, non-radiating pain Frequency: 1.5 hours ago Pertinent Negatives: Patient denies N/A Disposition: [x]$ ED /[]$ Urgent Care (no appt availability in office) / []$ Appointment(In office/virtual)/ []$  [facility]/ []$ Home Care/ []$ Refused Recommended Disposition /[]$ [facility]/ []$  Follow-up with PCP Additional Notes: Agrees to go to ED and someone is there to drive her.   Reason for Disposition  SEVERE chest pain  Answer Assessment - Initial Assessment Questions 1. LOCATION: "Where does it hurt?"       Center 2. RADIATION: "Does the pain go anywhere else?" (e.g., into neck, jaw, arms, back)     No 3. ONSET: "When did the chest pain begin?" (Minutes, hours or days)      1.5 hours ago 4. PATTERN: "Does the pain come and go, or has it been constant since it started?"  "Does it get worse with exertion?"      constant 5. DURATION: "How long does it last" (e.g., seconds, minutes, hours)     N/A 6. SEVERITY: "How bad is the pain?"  (e.g., Scale 1-10; mild, moderate, or severe)    - MILD (1-3): doesn't interfere with normal activities     - MODERATE (4-7): interferes with normal activities or awakens from sleep    - SEVERE (8-10): excruciating pain, unable to do any normal activities       8  Protocols used: Chest Pain-A-AH

## 2022-09-13 ENCOUNTER — Ambulatory Visit (INDEPENDENT_AMBULATORY_CARE_PROVIDER_SITE_OTHER): Payer: Medicare Other | Admitting: Internal Medicine

## 2022-09-13 ENCOUNTER — Encounter: Payer: Self-pay | Admitting: Internal Medicine

## 2022-09-13 ENCOUNTER — Telehealth: Payer: Self-pay

## 2022-09-13 VITALS — BP 122/64 | HR 67 | Ht 62.0 in | Wt 142.4 lb

## 2022-09-13 DIAGNOSIS — K219 Gastro-esophageal reflux disease without esophagitis: Secondary | ICD-10-CM | POA: Diagnosis not present

## 2022-09-13 DIAGNOSIS — R0789 Other chest pain: Secondary | ICD-10-CM

## 2022-09-13 DIAGNOSIS — G2581 Restless legs syndrome: Secondary | ICD-10-CM

## 2022-09-13 MED ORDER — ROPINIROLE HCL 0.25 MG PO TABS: 0.7500 mg | ORAL_TABLET | Freq: Every day | ORAL | 0 refills | Status: DC

## 2022-09-13 NOTE — Progress Notes (Signed)
Date:  09/13/2022   Name:  Roberta Ward   DOB:  16-Sep-1955   MRN:  [id number]   Chief Complaint: Hypertension Follow up from ED for chest pain.  All labs, CXR, EKG were normal.  She was given Toradol IV with relief.  The chest pain has not recurred.  Her BP was elevated but is has since returned to normal.    Chest Pain  This is a new problem. Episode onset: 3 days ago. The onset quality is sudden. The problem has been resolved. Pertinent negatives include no cough, diaphoresis, dizziness, fever, headaches, palpitations or shortness of breath. The pain is aggravated by nothing (no known inciting event).  Her past medical history is significant for hypertension.  Hypertension This is a chronic problem. The problem is controlled. Associated symptoms include chest pain. Pertinent negatives include no headaches, palpitations or shortness of breath. Past treatments include beta blockers. The current treatment provides significant improvement. There are no compliance problems.  There is no history of kidney disease, CAD/MI or CVA.  RLS - having more symptoms lately.  Symptoms begin around 7 pm but she is still taking medications at bedtime.  Requip 0.5 mg.   Urinary frequency - esp in the evening, has to go back to the restroom several times before she feels empty.  Has tried waiting for a few seconds after she thinks she is finished and that helps.  No prolapse symptoms, no pain, hematuria or other worrisome symptoms. Lab Results  Component Value Date   NA 137 09/10/2022   K 4.4 09/10/2022   CO2 25 09/10/2022   GLUCOSE 93 09/10/2022   BUN 19 09/10/2022   CREATININE 0.61 09/10/2022   CALCIUM 9.2 09/10/2022   EGFR 95 07/12/2022   GFRNONAA >60 09/10/2022   Lab Results  Component Value Date   CHOL 219 (H) 07/12/2022   HDL 55 07/12/2022   LDLCALC 141 (H) 07/12/2022   TRIG 129 07/12/2022   CHOLHDL 4.0 07/12/2022   Lab Results  Component Value Date   TSH 2.380 07/12/2022   Lab  Results  Component Value Date   HGBA1C 6.0 (H) 07/12/2022   Lab Results  Component Value Date   WBC 8.2 09/10/2022   HGB 14.4 09/10/2022   HCT 44.4 09/10/2022   MCV 99.6 09/10/2022   PLT 279 09/10/2022   Lab Results  Component Value Date   ALT 36 (H) 07/12/2022   AST 35 07/12/2022   ALKPHOS 54 07/12/2022   BILITOT 0.4 07/12/2022   Lab Results  Component Value Date   VD25OH 32.1 07/10/2021     Review of Systems  Constitutional:  Negative for chills, diaphoresis, fatigue and fever.  HENT:  Negative for trouble swallowing.   Respiratory:  Negative for cough, chest tightness and shortness of breath.   Cardiovascular:  Positive for chest pain. Negative for palpitations and leg swelling.  Gastrointestinal:        Intermittent gerd relieved by daily omeprazole  Genitourinary:  Positive for frequency. Negative for dysuria, hematuria, pelvic pain and vaginal discharge.  Neurological:  Negative for dizziness, light-headedness and headaches.  Psychiatric/Behavioral:  Negative for dysphoric mood and sleep disturbance. The patient is not nervous/anxious.     Patient Active Problem List   Diagnosis Date Noted   Restless legs syndrome (RLS) 07/12/2022   Cervical spondylosis 02/26/2022   Lumbar radiculopathy 02/05/2022   Essential hypertension 01/10/2022   Derangement of right shoulder joint 10/30/2021   Tremor 02/03/2021   Episode of moderate  major depression ([place]) 02/03/2021   Polyarthralgia 02/03/2021   Gastroesophageal reflux disease 09/20/2020   Mild hyperlipidemia 07/06/2020   Prediabetes 04/01/2020   Closed compression fracture of L2 lumbar vertebra, initial encounter ([place]) 03/02/2020   Age-related osteoporosis with current pathological fracture 03/01/2020   Primary insomnia 03/01/2020   Left foot pain 03/01/2020   Primary osteoarthritis of left knee 03/01/2020   Lumbar back pain 03/01/2020   Patient is Jehovah's Witness 03/01/2020   Colon polyp 08/14/2016   Vitamin D  deficiency 01/25/2016    Allergies  Allergen Reactions   Sulfa Antibiotics Hives and Itching   Latex    Talc     Other reaction(s): Angioedema    Past Surgical History:  Procedure Laterality Date   ABDOMINAL HYSTERECTOMY     KNEE CARTILAGE SURGERY Left    PARTIAL HYSTERECTOMY  1980   Rt ovary still present and cervix still present.   TONSILECTOMY/ADENOIDECTOMY WITH MYRINGOTOMY      Social History   Tobacco Use   Smoking status: Never   Smokeless tobacco: Never  Vaping Use   Vaping Use: Never used  Substance Use Topics   Alcohol use: Yes    Comment: rare- occasional   Drug use: Never     Medication list has been reviewed and updated.  Current Meds  Medication Sig   Ascorbic Acid (VITAMIN C) 1000 MG tablet Take 1,000 mg by mouth daily.   atenolol (TENORMIN) 25 MG tablet Take 1 tablet (25 mg total) by mouth daily.   Cholecalciferol (VITAMIN D) 50 MCG (2000 UT) CAPS Take by mouth.   clobetasol cream (TEMOVATE) [id number] % Apply 1 Application topically 2 (two) times daily.   denosumab (PROLIA) 60 MG/ML SOSY injection Inject into the skin. q6 months   DULoxetine (CYMBALTA) 30 MG capsule Take 1 capsule (30 mg total) by mouth daily.   magnesium oxide (MAG-OX) 400 MG tablet Take by mouth.   Melatonin 10 MG CAPS Take by mouth.   omeprazole (PRILOSEC) 20 MG capsule Take 1 capsule (20 mg total) by mouth daily.   rOPINIRole (REQUIP) 0.25 MG tablet Take 2 tablets (0.5 mg total) by mouth at bedtime.       09/13/2022   11:01 AM 08/29/2022    9:50 AM 07/12/2022    8:06 AM 01/10/2022    1:54 PM  GAD 7 : Generalized Anxiety Score  Nervous, Anxious, on Edge 0 0 0 0  Control/stop worrying 0 0 0 0  Worry too much - different things 0 0 3 0  Trouble relaxing 0 0 3 0  Restless 0 0 2 0  Easily annoyed or irritable 0 0 0 0  Afraid - awful might happen 0 0 1 0  Total GAD 7 Score 0 0 9 0  Anxiety Difficulty Not difficult at all Not difficult at all Very difficult Not difficult at all        09/13/2022   11:01 AM 08/29/2022    9:50 AM 07/12/2022    8:06 AM  Depression screen PHQ 2/9  Decreased Interest 0 0 0  Down, Depressed, Hopeless 0 0 0  PHQ - 2 Score 0 0 0  Altered sleeping 3 3 3  $ Tired, decreased energy 0 2 2  Change in appetite 0 0 0  Feeling bad or failure about yourself  0 0 0  Trouble concentrating 0 0 0  Moving slowly or fidgety/restless 0 0 0  Suicidal thoughts 0 0 0  PHQ-9 Score 3 5 5  $ Difficult  doing work/chores Not difficult at all Not difficult at all Very difficult    BP Readings from Last 3 Encounters:  09/13/22 122/64  09/10/22 (!) 167/77  08/29/22 (!) 148/92    Physical Exam Vitals and nursing note reviewed.  Constitutional:      General: She is not in acute distress.    Appearance: Normal appearance. She is well-developed.  HENT:     Head: Normocephalic and atraumatic.  Cardiovascular:     Rate and Rhythm: Normal rate and regular rhythm.     Heart sounds: No murmur heard. Pulmonary:     Effort: Pulmonary effort is normal. No respiratory distress.     Breath sounds: No wheezing or rhonchi.  Chest:     Chest wall: No mass, deformity, tenderness or crepitus.  Abdominal:     General: Abdomen is flat.     Palpations: Abdomen is soft.     Tenderness: There is no abdominal tenderness.  Musculoskeletal:     Cervical back: Normal range of motion.     Right lower leg: No edema.     Left lower leg: No edema.  Lymphadenopathy:     Cervical: No cervical adenopathy.  Skin:    General: Skin is warm and dry.     Findings: No rash.  Neurological:     Mental Status: She is alert and oriented to person, place, and time.  Psychiatric:        Mood and Affect: Mood normal.        Behavior: Behavior normal.     Wt Readings from Last 3 Encounters:  09/13/22 142 lb 6.4 oz (64.6 kg)  09/10/22 139 lb (63 kg)  08/29/22 139 lb (63 kg)    BP 122/64   Pulse 67   Ht 5' 2"$  (1.575 m)   Wt 142 lb 6.4 oz (64.6 kg)   SpO2 95%   BMI 26.05  kg/m   Assessment and Plan: Problem List Items Addressed This Visit       Digestive   Gastroesophageal reflux disease (Chronic)    Symptoms well controlled on daily PPI. No red flag signs such as weight loss, n/v, melena. This could have contributed to recent atypical chest pain. Continue omeprazole daily.         Other   Restless legs syndrome (RLS) (Chronic)    Symptoms starting in the early evening now Recommend taking 0.5 mg requip about one hour before symptoms typically start Can also take a third dose near bedtime if needed      Relevant Medications   rOPINIRole (REQUIP) 0.25 MG tablet   Other Visit Diagnoses     Chest pain, atypical    -  Primary   ED workup negative if symptoms recur, would recommend Cardiology evaluation        Partially dictated using Dragon software. Any errors are unintentional.  Halina Maidens, MD [facility]  09/13/2022

## 2022-09-13 NOTE — Transitions of Care (Post Inpatient/ED Visit) (Signed)
   09/13/2022  Name: Roberta Ward MRN: [id number] DOB: 1955/08/06  Today's TOC FU Call Status: Today's TOC FU Call Status:: Successful TOC FU Call Competed TOC FU Call Complete Date: 09/13/22  Transition Care Management Follow-up Telephone Call Date of Discharge: 09/10/22 Discharge Facility: [facility]) Type of Discharge: Emergency Department Reason for ED Visit: Cardiac Conditions Cardiac Conditions Diagnosis: Chest Pain Persisting How have you been since you were released from the hospital?: Better  Items Reviewed: Did you receive and understand the discharge instructions provided?: Yes Medications obtained and verified?: No Any new allergies since your discharge?: No Dietary orders reviewed?: No Do you have support at home?: Yes People in Home: spouse  Home Care and Equipment/Supplies: [facility] Ordered?: No  Functional Questionnaire: Do you need assistance with bathing/showering or dressing?: No Do you need assistance with meal preparation?: No Do you need assistance with eating?: No Do you have difficulty maintaining continence: No Do you need assistance with getting out of bed/getting out of a chair/moving?: No Do you have difficulty managing or taking your medications?: No  Folllow up appointments reviewed: PCP Follow-up appointment confirmed?: Yes Date of PCP follow-up appointment?: 09/13/22 Follow-up Provider: Halina Maidens , MD [facility] Follow-up appointment confirmed?: No Do you need transportation to your follow-up appointment?: No Do you understand care options if your condition(s) worsen?: Yes-patient verbalized understanding    [facility] [place], [address]  [place] [address] Office [phone]  Fax: [phone]

## 2022-09-13 NOTE — Assessment & Plan Note (Signed)
Symptoms well controlled on daily PPI. No red flag signs such as weight loss, n/v, melena. This could have contributed to recent atypical chest pain. Continue omeprazole daily.

## 2022-09-13 NOTE — Assessment & Plan Note (Signed)
Symptoms starting in the early evening now Recommend taking 0.5 mg requip about one hour before symptoms typically start Can also take a third dose near bedtime if needed

## 2022-09-14 ENCOUNTER — Telehealth: Payer: Self-pay

## 2022-09-14 NOTE — Telephone Encounter (Signed)
     Patient  visit on 2/19  at [facility]    Have you been able to follow up with your primary care physician? Yes   The patient was or was not able to obtain any needed medicine or equipment. Yes   Are there diet recommendations that you are having difficulty following? Na   Patient expresses understanding of discharge instructions and education provided has no other needs at this time.  Yes      [place] [phone] [address], [place], [place] [address] Phone: [phone] Email: [email or web address]

## 2022-12-10 ENCOUNTER — Ambulatory Visit
Admission: RE | Admit: 2022-12-10 | Discharge: 2022-12-10 | Disposition: A | Discharge status: Home / Self Care | Payer: Medicare Other | Source: Ambulatory Visit | Attending: Internal Medicine | Admitting: Internal Medicine

## 2022-12-10 DIAGNOSIS — Z1231 Encounter for screening mammogram for malignant neoplasm of breast: Secondary | ICD-10-CM | POA: Diagnosis present

## 2022-12-12 ENCOUNTER — Encounter: Payer: Self-pay | Admitting: Internal Medicine

## 2022-12-12 ENCOUNTER — Ambulatory Visit (INDEPENDENT_AMBULATORY_CARE_PROVIDER_SITE_OTHER): Payer: Medicare Other | Admitting: Internal Medicine

## 2022-12-12 VITALS — BP 128/72 | HR 69 | Ht 62.0 in | Wt 144.0 lb

## 2022-12-12 DIAGNOSIS — F321 Major depressive disorder, single episode, moderate: Secondary | ICD-10-CM | POA: Diagnosis not present

## 2022-12-12 DIAGNOSIS — R251 Tremor, unspecified: Secondary | ICD-10-CM

## 2022-12-12 DIAGNOSIS — K219 Gastro-esophageal reflux disease without esophagitis: Secondary | ICD-10-CM

## 2022-12-12 DIAGNOSIS — I10 Essential (primary) hypertension: Secondary | ICD-10-CM

## 2022-12-12 MED ORDER — OMEPRAZOLE 20 MG PO CPDR: 20.0000 mg | DELAYED_RELEASE_CAPSULE | Freq: Every day | ORAL | 1 refills | Status: DC

## 2022-12-12 NOTE — Progress Notes (Signed)
Date:  12/12/2022   Name:  Roberta Ward   DOB:  1956-01-22   MRN:  [id number]   Chief Complaint: Hypertension  Hypertension This is a chronic problem. The problem is controlled. Pertinent negatives include no chest pain, headaches, palpitations or shortness of breath. Past treatments include beta blockers. The current treatment provides significant improvement.   RLS - she has now stopped Ropinirole because she is taking gabapentin for both her back and leg symptoms.  Tremor - not much improvement with atenolol.  Gabapentin may help but would see Neurology if worsening.    Lab Results  Component Value Date   NA 137 09/10/2022   K 4.4 09/10/2022   CO2 25 09/10/2022   GLUCOSE 93 09/10/2022   BUN 19 09/10/2022   CREATININE 0.61 09/10/2022   CALCIUM 9.2 09/10/2022   EGFR 95 07/12/2022   GFRNONAA >60 09/10/2022   Lab Results  Component Value Date   CHOL 219 (H) 07/12/2022   HDL 55 07/12/2022   LDLCALC 141 (H) 07/12/2022   TRIG 129 07/12/2022   CHOLHDL 4.0 07/12/2022   Lab Results  Component Value Date   TSH 2.380 07/12/2022   Lab Results  Component Value Date   HGBA1C 6.0 (H) 07/12/2022   Lab Results  Component Value Date   WBC 8.2 09/10/2022   HGB 14.4 09/10/2022   HCT 44.4 09/10/2022   MCV 99.6 09/10/2022   PLT 279 09/10/2022   Lab Results  Component Value Date   ALT 36 (H) 07/12/2022   AST 35 07/12/2022   ALKPHOS 54 07/12/2022   BILITOT 0.4 07/12/2022   Lab Results  Component Value Date   VD25OH 32.1 07/10/2021     Review of Systems  Constitutional:  Negative for fatigue and unexpected weight change.  HENT:  Negative for nosebleeds.   Eyes:  Negative for visual disturbance.  Respiratory:  Negative for cough, chest tightness, shortness of breath and wheezing.   Cardiovascular:  Negative for chest pain, palpitations and leg swelling.  Gastrointestinal:  Negative for abdominal pain, constipation and diarrhea.  Neurological:  Negative for  dizziness, weakness, light-headedness and headaches.  Psychiatric/Behavioral:  Negative for dysphoric mood and sleep disturbance. The patient is not nervous/anxious.     Patient Active Problem List   Diagnosis Date Noted   Restless legs syndrome (RLS) 07/12/2022   Cervical spondylosis 02/26/2022   Lumbar radiculopathy 02/05/2022   Essential hypertension 01/10/2022   Derangement of right shoulder joint 10/30/2021   Tremor 02/03/2021   Episode of moderate major depression (HCC) 02/03/2021   Polyarthralgia 02/03/2021   Gastroesophageal reflux disease 09/20/2020   Mild hyperlipidemia 07/06/2020   Prediabetes 04/01/2020   Closed compression fracture of L2 lumbar vertebra, initial encounter (HCC) 03/02/2020   Age-related osteoporosis with current pathological fracture 03/01/2020   Primary insomnia 03/01/2020   Primary osteoarthritis of left knee 03/01/2020   Lumbar back pain 03/01/2020   Patient is Jehovah's Witness 03/01/2020   Colon polyp 08/14/2016   Vitamin D deficiency 01/25/2016    Allergies  Allergen Reactions   Sulfa Antibiotics Hives and Itching   Latex    Talc     Other reaction(s): Angioedema    Past Surgical History:  Procedure Laterality Date   ABDOMINAL HYSTERECTOMY     KNEE CARTILAGE SURGERY Left    PARTIAL HYSTERECTOMY  1980   Rt ovary still present and cervix still present.   TONSILECTOMY/ADENOIDECTOMY WITH MYRINGOTOMY      Social History   Tobacco Use  Smoking status: Never   Smokeless tobacco: Never  Vaping Use   Vaping Use: Never used  Substance Use Topics   Alcohol use: Yes    Comment: rare- occasional   Drug use: Never     Medication list has been reviewed and updated.  Current Meds  Medication Sig   Ascorbic Acid (VITAMIN C) 1000 MG tablet Take 1,000 mg by mouth daily.   atenolol (TENORMIN) 25 MG tablet Take 1 tablet (25 mg total) by mouth daily.   Cholecalciferol (VITAMIN D) 50 MCG (2000 UT) CAPS Take by mouth.   clobetasol cream  (TEMOVATE) 0.05 % Apply 1 Application topically 2 (two) times daily.   denosumab (PROLIA) 60 MG/ML SOSY injection Inject into the skin. q6 months   gabapentin (NEURONTIN) 300 MG capsule Take 300 mg by mouth 3 (three) times daily.   magnesium oxide (MAG-OX) 400 MG tablet Take by mouth.   Melatonin 10 MG CAPS Take by mouth.   nortriptyline (PAMELOR) 25 MG capsule Take 25 mg by mouth 2 (two) times daily.   [DISCONTINUED] DULoxetine (CYMBALTA) 30 MG capsule Take 1 capsule (30 mg total) by mouth daily.   [DISCONTINUED] omeprazole (PRILOSEC) 20 MG capsule Take 1 capsule (20 mg total) by mouth daily.   [DISCONTINUED] rOPINIRole (REQUIP) 0.25 MG tablet Take 3 tablets (0.75 mg total) by mouth at bedtime.       12/12/2022    8:31 AM 09/13/2022   11:01 AM 08/29/2022    9:50 AM 07/12/2022    8:06 AM  GAD 7 : Generalized Anxiety Score  Nervous, Anxious, on Edge 0 0 0 0  Control/stop worrying 0 0 0 0  Worry too much - different things 0 0 0 3  Trouble relaxing 0 0 0 3  Restless 0 0 0 2  Easily annoyed or irritable 0 0 0 0  Afraid - awful might happen 0 0 0 1  Total GAD 7 Score 0 0 0 9  Anxiety Difficulty Not difficult at all Not difficult at all Not difficult at all Very difficult       12/12/2022    8:31 AM 09/13/2022   11:01 AM 08/29/2022    9:50 AM  Depression screen PHQ 2/9  Decreased Interest 0 0 0  Down, Depressed, Hopeless 0 0 0  PHQ - 2 Score 0 0 0  Altered sleeping 3 3 3   Tired, decreased energy 2 0 2  Change in appetite 0 0 0  Feeling bad or failure about yourself  0 0 0  Trouble concentrating 0 0 0  Moving slowly or fidgety/restless 0 0 0  Suicidal thoughts 0 0 0  PHQ-9 Score 5 3 5   Difficult doing work/chores Not difficult at all Not difficult at all Not difficult at all    BP Readings from Last 3 Encounters:  12/12/22 128/72  09/13/22 122/64  09/10/22 (!) 167/77    Physical Exam Vitals and nursing note reviewed.  Constitutional:      General: She is not in acute  distress.    Appearance: She is well-developed.  HENT:     Head: Normocephalic and atraumatic.  Cardiovascular:     Rate and Rhythm: Normal rate and regular rhythm.     Heart sounds: No murmur heard. Pulmonary:     Effort: Pulmonary effort is normal. No respiratory distress.     Breath sounds: No wheezing or rhonchi.  Musculoskeletal:     Cervical back: Normal range of motion and neck supple.  Right lower leg: No edema.     Left lower leg: No edema.  Lymphadenopathy:     Cervical: No cervical adenopathy.  Skin:    General: Skin is warm and dry.     Capillary Refill: Capillary refill takes less than 2 seconds.     Findings: No rash.  Neurological:     General: No focal deficit present.     Mental Status: She is alert and oriented to person, place, and time.  Psychiatric:        Mood and Affect: Mood normal.        Behavior: Behavior normal.     Wt Readings from Last 3 Encounters:  12/12/22 144 lb (65.3 kg)  09/13/22 142 lb 6.4 oz (64.6 kg)  09/10/22 139 lb (63 kg)    BP 128/72   Pulse 69   Ht 5\' 2"  (1.575 m)   Wt 144 lb (65.3 kg)   SpO2 95%   BMI 26.34 kg/m   Assessment and Plan:  Problem List Items Addressed This Visit     Tremor (Chronic)    Did not improve with resuming atenolol May need to see Neurology again for specific treatment      Gastroesophageal reflux disease (Chronic)   Relevant Medications   omeprazole (PRILOSEC) 20 MG capsule   Essential hypertension - Primary    BP elevated last visit - atenolol started to help with BP and tremor. BP much improved today - will continue current regimen      Episode of moderate major depression (HCC)    Stopped cymbalta started last visit since she is taking gabapentin for back pain. Cymbalta was prescribed to help with both issues. Will continue to monitor for depressive symptoms.      Relevant Medications   nortriptyline (PAMELOR) 25 MG capsule    Return in about 6 months (around 06/14/2023) for  Medicare annual.   Partially dictated using Dragon software, any errors are not intentional.  Reubin Milan, MD [facility] [place], [place]

## 2022-12-12 NOTE — Assessment & Plan Note (Signed)
Stopped cymbalta started last visit since she is taking gabapentin for back pain. Cymbalta was prescribed to help with both issues. Will continue to monitor for depressive symptoms.

## 2022-12-12 NOTE — Assessment & Plan Note (Signed)
Did not improve with resuming atenolol May need to see Neurology again for specific treatment

## 2022-12-12 NOTE — Assessment & Plan Note (Addendum)
BP elevated last visit - atenolol started to help with BP and tremor. BP much improved today - will continue current regimen

## 2022-12-18 ENCOUNTER — Emergency Department
Admission: EM | Admit: 2022-12-18 | Discharge: 2022-12-19 | Disposition: A | Discharge status: Home / Self Care | Payer: Medicare Other | Attending: Emergency Medicine | Admitting: Emergency Medicine

## 2022-12-18 ENCOUNTER — Emergency Department: Payer: Medicare Other

## 2022-12-18 ENCOUNTER — Ambulatory Visit: Payer: Self-pay

## 2022-12-18 ENCOUNTER — Encounter: Payer: Self-pay | Admitting: Emergency Medicine

## 2022-12-18 ENCOUNTER — Other Ambulatory Visit: Payer: Self-pay

## 2022-12-18 DIAGNOSIS — R079 Chest pain, unspecified: Secondary | ICD-10-CM

## 2022-12-18 DIAGNOSIS — I10 Essential (primary) hypertension: Secondary | ICD-10-CM | POA: Diagnosis not present

## 2022-12-18 DIAGNOSIS — R072 Precordial pain: Secondary | ICD-10-CM | POA: Diagnosis present

## 2022-12-18 DIAGNOSIS — Z91040 Latex allergy status: Secondary | ICD-10-CM | POA: Insufficient documentation

## 2022-12-18 DIAGNOSIS — K219 Gastro-esophageal reflux disease without esophagitis: Secondary | ICD-10-CM | POA: Diagnosis not present

## 2022-12-18 LAB — HEPATIC FUNCTION PANEL
ALT: 53 U/L — ABNORMAL HIGH (ref 0–44)
AST: 39 U/L (ref 15–41)
Albumin: 4.4 g/dL (ref 3.5–5.0)
Alkaline Phosphatase: 43 U/L (ref 38–126)
Bilirubin, Direct: <0.1 mg/dL (ref 0.0–0.2)
Total Bilirubin: 0.9 mg/dL (ref 0.3–1.2)
Total Protein: 7.3 g/dL (ref 6.5–8.1)

## 2022-12-18 LAB — CBC
HCT: 45.4 % (ref 36.0–46.0)
Hemoglobin: 15.2 g/dL — ABNORMAL HIGH (ref 12.0–15.0)
MCH: 33.3 pg (ref 26.0–34.0)
MCHC: 33.5 g/dL (ref 30.0–36.0)
MCV: 99.3 fL (ref 80.0–100.0)
Platelets: 314 10*3/uL (ref 150–400)
RBC: 4.57 MIL/uL (ref 3.87–5.11)
RDW: 12.5 % (ref 11.5–15.5)
WBC: 10.1 10*3/uL (ref 4.0–10.5)
nRBC: 0 % (ref 0.0–0.2)

## 2022-12-18 LAB — LIPASE, BLOOD: Lipase: 40 U/L (ref 11–51)

## 2022-12-18 LAB — BASIC METABOLIC PANEL
Anion gap: 14 (ref 5–15)
BUN: 22 mg/dL (ref 8–23)
CO2: 24 mmol/L (ref 22–32)
Calcium: 10.7 mg/dL — ABNORMAL HIGH (ref 8.9–10.3)
Chloride: 101 mmol/L (ref 98–111)
Creatinine, Ser: 0.78 mg/dL (ref 0.44–1.00)
GFR, Estimated: >60 mL/min (ref >60)
Glucose, Bld: 109 mg/dL — ABNORMAL HIGH (ref 70–99)
Potassium: 4 mmol/L (ref 3.5–5.1)
Sodium: 139 mmol/L (ref 135–145)

## 2022-12-18 LAB — TROPONIN I (HIGH SENSITIVITY)
Troponin I (High Sensitivity): 18 ng/L — ABNORMAL HIGH (ref <18)
Troponin I (High Sensitivity): 18 ng/L — ABNORMAL HIGH (ref <18)

## 2022-12-18 MED ORDER — ALUM & MAG HYDROXIDE-SIMETH 200-200-20 MG/5ML PO SUSP
30.0000 mL | Freq: Once | ORAL | Status: AC
  Administered 2022-12-18: 30 mL via ORAL
  Filled 2022-12-18: qty 30

## 2022-12-18 MED ORDER — LIDOCAINE VISCOUS HCL 2 % MT SOLN
15.0000 mL | Freq: Once | OROMUCOSAL | Status: AC
  Administered 2022-12-18: 15 mL via OROMUCOSAL
  Filled 2022-12-18: qty 15

## 2022-12-18 MED ORDER — FAMOTIDINE IN NACL 20-0.9 MG/50ML-% IV SOLN
20.0000 mg | Freq: Once | INTRAVENOUS | Status: AC
  Administered 2022-12-18: 20 mg via INTRAVENOUS
  Filled 2022-12-18: qty 50

## 2022-12-18 NOTE — ED Provider Triage Note (Signed)
Emergency Medicine Provider Triage Evaluation Note  Roberta Ward, a 67 y.o. female  was evaluated in triage.  Pt complains of chest pain midsternal and epigastric for the last week and a half.  Patient reports symptoms of heartburn and used to take medicines for the same but discontinued them.  She describes burning pressure and stabbing chest pain all the way into her throat patient denies any frank nausea, vomiting, or hematemesis.  Review of Systems  Positive: Reflux, CP Negative: NVD  Physical Exam  BP (!) 148/98 (BP Location: Left Arm)   Pulse 79   Temp 98.3 F (36.8 C) (Oral)   Resp 18   SpO2 96%  Gen:   Awake, no distress  NAD Resp:  Normal effort CTA MSK:   Moves extremities without difficulty  ABD:  Soft, nontender  Medical Decision Making  Medically screening exam initiated at 10:10 PM.  Appropriate orders placed.  Kayelyn Mattia was informed that the remainder of the evaluation will be completed by another provider, this initial triage assessment does not replace that evaluation, and the importance of remaining in the ED until their evaluation is complete.  Patient to the ED for a week and a half of midsternal epigastric chest pain with associated reflux and burning sensation.   Lissa Hoard, PA-C 12/18/22 2212

## 2022-12-18 NOTE — ED Triage Notes (Signed)
Patient to ED via POV for chest pain x1.5 week. Patient states that she has hx of heartburn and use to take medicine for same but has stopped working. Stabbing/burning in chest that goes up into throat.

## 2022-12-18 NOTE — ED Provider Notes (Signed)
[facility] Provider Note    Event Date/Time   First MD Initiated Contact with Patient 12/18/22 2336     (approximate)   History   Chest Pain   HPI  Roberta Ward is a 67 y.o. female who presents to the ED from home with a chief complaint of substernal chest pain x 1.5 weeks.  Patient describes constant sensation of burning/heartburn.  Her PCP prescribed omeprazole but patient only took it for 1 month and self discontinued.  That was several months ago.  Symptoms are not exacerbated by food, deep inspiration, movement or cough.  Denies associated diaphoresis, nausea/vomiting or dizziness.  Denies fever/chills, cough, abdominal pain.  Denies recent travel, trauma or hormone use.     Past Medical History  History reviewed. No pertinent past medical history.   Active Problem List   Patient Active Problem List   Diagnosis Date Noted   Restless legs syndrome (RLS) 07/12/2022   Cervical spondylosis 02/26/2022   Lumbar radiculopathy 02/05/2022   Essential hypertension 01/10/2022   Derangement of right shoulder joint 10/30/2021   Tremor 02/03/2021   Episode of moderate major depression (HCC) 02/03/2021   Polyarthralgia 02/03/2021   Gastroesophageal reflux disease 09/20/2020   Mild hyperlipidemia 07/06/2020   Prediabetes 04/01/2020   Closed compression fracture of L2 lumbar vertebra, initial encounter (HCC) 03/02/2020   Age-related osteoporosis with current pathological fracture 03/01/2020   Primary insomnia 03/01/2020   Primary osteoarthritis of left knee 03/01/2020   Lumbar back pain 03/01/2020   Patient is Jehovah's Witness 03/01/2020   Colon polyp 08/14/2016   Vitamin D deficiency 01/25/2016     Past Surgical History   Past Surgical History:  Procedure Laterality Date   ABDOMINAL HYSTERECTOMY     KNEE CARTILAGE SURGERY Left    PARTIAL HYSTERECTOMY  1980   Rt ovary still present and cervix still present.   TONSILECTOMY/ADENOIDECTOMY WITH  MYRINGOTOMY       Home Medications   Prior to Admission medications   Medication Sig Start Date End Date Taking? Authorizing Provider  famotidine (PEPCID) 20 MG tablet Take 1 tablet (20 mg total) by mouth 2 (two) times daily. 12/19/22  Yes Irean Hong, MD  sucralfate (CARAFATE) 1 GM/10ML suspension Take 10 mLs (1 g total) by mouth 4 (four) times daily. 12/19/22  Yes Irean Hong, MD  Ascorbic Acid (VITAMIN C) 1000 MG tablet Take 1,000 mg by mouth daily.    [provider]  atenolol (TENORMIN) 25 MG tablet Take 1 tablet (25 mg total) by mouth daily. 08/29/22   Reubin Milan, MD  Cholecalciferol (VITAMIN D) 50 MCG (2000 UT) CAPS Take by mouth.    [provider]  clobetasol cream (TEMOVATE) 0.05 % Apply 1 Application topically 2 (two) times daily. 08/29/22   Reubin Milan, MD  denosumab (PROLIA) 60 MG/ML SOSY injection Inject into the skin. q6 months    [provider]  gabapentin (NEURONTIN) 300 MG capsule Take 300 mg by mouth 3 (three) times daily. 11/28/22   [provider]  magnesium oxide (MAG-OX) 400 MG tablet Take by mouth.    [provider]  Melatonin 10 MG CAPS Take by mouth.    [provider]  nortriptyline (PAMELOR) 25 MG capsule Take 25 mg by mouth 2 (two) times daily.    [provider]  omeprazole (PRILOSEC) 20 MG capsule Take 1 capsule (20 mg total) by mouth daily. 12/12/22   Reubin Milan, MD  Allergies  Sulfa antibiotics, Latex, and Talc   Family History   Family History  Problem Relation Age of Onset   Breast cancer Mother    Breast cancer Maternal Aunt      Physical Exam  Triage Vital Signs: ED Triage Vitals [12/18/22 1708]  Enc Vitals Group     BP (!) 148/98     Pulse Rate 79     Resp 18     Temp 98.3 F (36.8 C)     Temp Source Oral     SpO2 96 %     Weight      Height      Head Circumference      Peak Flow      Pain Score 9     Pain Loc      Pain Edu?      Excl. in GC?      Updated Vital Signs: BP 132/62   Pulse 70   Temp 98.5 F (36.9 C)   Resp 16   SpO2 98%    General: Awake, no distress.  CV:  RRR.  Good peripheral perfusion.  Resp:  Normal effort.  CTAB. Abd:  Nontender to light or deep palpation.  No distention.  Other:  Bilateral calves are supple and nontender.   ED Results / Procedures / Treatments  Labs (all labs ordered are listed, but only abnormal results are displayed) Labs Reviewed  BASIC METABOLIC PANEL - Abnormal; Notable for the following components:      Result Value   Glucose, Bld 109 (*)    Calcium 10.7 (*)    All other components within normal limits  CBC - Abnormal; Notable for the following components:   Hemoglobin 15.2 (*)    All other components within normal limits  HEPATIC FUNCTION PANEL - Abnormal; Notable for the following components:   ALT 53 (*)    All other components within normal limits  TROPONIN I (HIGH SENSITIVITY) - Abnormal; Notable for the following components:   Troponin I (High Sensitivity) 18 (*)    All other components within normal limits  TROPONIN I (HIGH SENSITIVITY) - Abnormal; Notable for the following components:   Troponin I (High Sensitivity) 18 (*)    All other components within normal limits  LIPASE, BLOOD  D-DIMER, QUANTITATIVE     EKG  ED ECG REPORT I, Nehemias Sauceda J, the attending physician, personally viewed and interpreted this ECG.   Date: 12/19/2022  EKG Time: 1713  Rate: 77  Rhythm: normal sinus rhythm  Axis: Normal  Intervals:none  ST&T Change: Nonspecific    RADIOLOGY I have ended pendantly visualized and interpreted patient's x-ray as well as noted the radiology interpretation:  Chest x-ray: No acute cardiopulmonary process  Official radiology report(s): DG Chest 2 View  Result Date: 12/18/2022 CLINICAL DATA:  Chest pain EXAM: CHEST - 2 VIEW COMPARISON:  09/10/2022 FINDINGS: The heart size and mediastinal contours are within normal limits. Both lungs are  clear. The visualized skeletal structures are unremarkable. IMPRESSION: No active cardiopulmonary disease. Electronically Signed   By: Judie Petit.  Shick M.D.   On: 12/18/2022 18:31     PROCEDURES:  Critical Care performed: No  .1-3 Lead EKG Interpretation  Performed by: Irean Hong, MD Authorized by: Irean Hong, MD     Interpretation: normal     ECG rate:  75   ECG rate assessment: normal     Rhythm: sinus rhythm     Ectopy: none     Conduction:  normal   Comments:     Patient placed on cardiac monitor to evaluate for arrhythmias    MEDICATIONS ORDERED IN ED: Medications  alum & mag hydroxide-simeth (MAALOX/MYLANTA) 200-200-20 MG/5ML suspension 30 mL (30 mLs Oral Given 12/18/22 2358)  lidocaine (XYLOCAINE) 2 % viscous mouth solution 15 mL (15 mLs Mouth/Throat Given 12/18/22 2358)  famotidine (PEPCID) IVPB 20 mg premix (0 mg Intravenous Stopped 12/19/22 0030)     IMPRESSION / MDM / ASSESSMENT AND PLAN / ED COURSE  I reviewed the triage vital signs and the nursing notes.                             67 year old female presenting with substernal chest/epigastric burning sensation. Differential diagnosis includes, but is not limited to, ACS, aortic dissection, pulmonary embolism, cardiac tamponade, pneumothorax, pneumonia, pericarditis, myocarditis, GI-related causes including esophagitis/gastritis, and musculoskeletal chest wall pain.   Personally reviewed patient's records and note her last PCP office visit on 12/12/2022 for hypertension.  Patient's presentation is most consistent with acute presentation with potential threat to life or bodily function.  The patient is on the cardiac monitor to evaluate for evidence of arrhythmia and/or significant heart rate changes.  Laboratory results demonstrate normal WBC of 10, unremarkable electrolytes including LFTs/lipase.  2 sets of stable troponin.  X-ray is clear.  Will add D-dimer, administer GI cocktail, IV Pepcid and reassess.  Clinical  Course as of 12/19/22 0537  Wed Dec 19, 2022  0021 Hepatic panel, lipase and Ddimer are negative.  Patient is feeling better after GI cocktail and Pepcid.  Have encouraged her to resume omeprazole.  Add Pepcid, Carafate and refer to GI for outpatient follow-up.  Strict return precautions given.  Patient and spouse verbalized understanding and agree with plan of care. [JS]    Clinical Course User Index [JS] Irean Hong, MD     FINAL CLINICAL IMPRESSION(S) / ED DIAGNOSES   Final diagnoses:  Nonspecific chest pain  Gastroesophageal reflux disease, unspecified whether esophagitis present     Rx / DC Orders   ED Discharge Orders          Ordered    sucralfate (CARAFATE) 1 GM/10ML suspension  4 times daily        12/19/22 0047    famotidine (PEPCID) 20 MG tablet  2 times daily        12/19/22 0047             Note:  This document was prepared using Dragon voice recognition software and may include unintentional dictation errors.   Irean Hong, MD 12/19/22 [phone]

## 2022-12-18 NOTE — Telephone Encounter (Signed)
Chief Complaint: Heartburn Symptoms: 10/10 pain, stabbing up to neck from mid chest Frequency: 1.5 weeks, but more severe after lunch today Pertinent Negatives: Patient denies other symptoms Disposition: [x] ED /[] Urgent Care (no appt availability in office) / [] Appointment(In office/virtual)/ []  [facility]/ [] Home Care/ [] Refused Recommended Disposition /[] [facility]/ []  Follow-up with PCP Additional Notes: She says she was on omeprazole, but stopped it 2 days ago because it wasn't working. She says she's taken pepto bismol and the pain is relieved for a short time, but it comes back. Advised due to the severity of the pain, ED is advised to rule out heart issues and diagnose heart burn. Patient verbalized understanding of going to ED.   Reason for Disposition  [1] Chest pain lasts > 5 minutes AND [2] occurred in past 3 days (72 hours) (Exception: Feels exactly the same as previously diagnosed heartburn and has accompanying sour taste in mouth.)  Answer Assessment - Initial Assessment Questions 1. LOCATION: "Where does it hurt?"       Chest  2. RADIATION: "Does the pain go anywhere else?" (e.g., into neck, jaw, arms, back)     Up to throat 3. ONSET: "When did the chest pain begin?" (Minutes, hours or days)      After lunch became severe, but having this pain for past 1.5 weeks 4. PATTERN: "Does the pain come and go, or has it been constant since it started?"  "Does it get worse with exertion?"      Constant 5. DURATION: "How long does it last" (e.g., seconds, minutes, hours)     N/A 6. SEVERITY: "How bad is the pain?"  (e.g., Scale 1-10; mild, moderate, or severe)    - MILD (1-3): doesn't interfere with normal activities     - MODERATE (4-7): interferes with normal activities or awakens from sleep    - SEVERE (8-10): excruciating pain, unable to do any normal activities       Stabbing pain 10 7. CARDIAC RISK FACTORS: "Do you have any history of heart problems or  risk factors for heart disease?" (e.g., angina, prior heart attack; diabetes, high blood pressure, high cholesterol, smoker, or strong family history of heart disease)     High blood pressure 8. PULMONARY RISK FACTORS: "Do you have any history of lung disease?"  (e.g., blood clots in lung, asthma, emphysema, birth control pills)     N/A 9. CAUSE: "What do you think is causing the chest pain?"     Heart burn 10. OTHER SYMPTOMS: "Do you have any other symptoms?" (e.g., dizziness, nausea, vomiting, sweating, fever, difficulty breathing, cough)       No  Protocols used: Chest Pain-A-AH

## 2022-12-19 LAB — D-DIMER, QUANTITATIVE: D-Dimer, Quant: 0.43 ug/mL-FEU (ref 0.00–0.50)

## 2022-12-19 MED ORDER — FAMOTIDINE 20 MG PO TABS: 20.0000 mg | ORAL_TABLET | Freq: Two times a day (BID) | ORAL | 0 refills | Status: DC

## 2022-12-19 MED ORDER — SUCRALFATE 1 GM/10ML PO SUSP: 1.0000 g | Freq: Four times a day (QID) | ORAL | 1 refills | Status: DC

## 2022-12-19 NOTE — Discharge Instructions (Signed)
Resume Omeprazole as prescribed by your doctor.  Add Carafate and Pepcid as instructed.  Eat a bland diet for 1 week, then slowly advance diet as tolerated.  Return to the ER for worsening symptoms, persistent vomiting, difficulty breathing or other concerns.

## 2022-12-21 ENCOUNTER — Telehealth: Payer: Self-pay

## 2022-12-21 ENCOUNTER — Telehealth: Payer: Self-pay | Admitting: *Deleted

## 2022-12-21 NOTE — Transitions of Care (Post Inpatient/ED Visit) (Unsigned)
   12/21/2022  Name: [name] MRN: [id number] DOB: 1955-08-15  Today's TOC FU Call Status: Today's TOC FU Call Status:: Unsuccessul Call (1st Attempt) Unsuccessful Call (1st Attempt) Date: 12/21/22  Attempted to reach the patient regarding the most recent Inpatient/ED visit.  Follow Up Plan: Additional outreach attempts will be made to reach the patient to complete the Transitions of Care (Post Inpatient/ED visit) call.   Jenine Krisher [facility]  Primary Care & Sports Medicine at [facility], [facility] [address]  [place] [address] Office [phone]  Fax: [phone]

## 2022-12-21 NOTE — Transitions of Care (Post Inpatient/ED Visit) (Signed)
   12/21/2022  Name: Roberta Ward MRN: [id number] DOB: 01-Sep-1955  Today's TOC FU Call Status: Today's TOC FU Call Status:: Unsuccessful Call (2nd Attempt) Unsuccessful Call (2nd Attempt) Date: 12/21/22  Attempted to reach the patient regarding the most recent Inpatient/ED visit.  Follow Up Plan: Additional outreach attempts will be made to reach the patient to complete the Transitions of Care (Post Inpatient/ED visit) call.   Gean Maidens BSN RN [facility] [phone]

## 2022-12-21 NOTE — Transitions of Care (Post Inpatient/ED Visit) (Signed)
12/21/2022  Name: Roberta Ward MRN: [id number] DOB: November 16, 1955  Today's TOC FU Call Status: Today's TOC FU Call Status:: Successful TOC FU Call Competed Unsuccessful Call (2nd Attempt) Date: 12/21/22 [facility] FU Call Complete Date: 12/21/22  Transition Care Management Follow-up Telephone Call Date of Discharge: 12/19/22 Discharge Facility: [facility]) Type of Discharge: Emergency Department Reason for ED Visit: Other: (non specific chest pain/[place]) How have you been since you were released from the hospital?: Better Any questions or concerns?: No  Items Reviewed: Did you receive and understand the discharge instructions provided?: No (Patient had not read them) Medications obtained,verified, and reconciled?: Yes (Medications Reviewed) (RN weent over the medications and what the patient should take.) Any new allergies since your discharge?: No Dietary orders reviewed?: Yes Type of Diet Ordered:: Bland diet x 1 week and then advance Do you have support at home?: Yes People in Home: spouse Name of Support/Comfort Primary Source: [place]  Medications Reviewed Today: Medications Reviewed Today     Reviewed by Luella Cook, RN (Case Manager) on 12/21/22 at 1534  Med List Status: <None>   Medication Order Taking? Sig Documenting Provider Last Dose Status Informant  Ascorbic Acid (VITAMIN C) 1000 MG tablet [id number] Yes Take 1,000 mg by mouth daily. [provider] Taking Active   atenolol (TENORMIN) 25 MG tablet [id number] Yes Take 1 tablet (25 mg total) by mouth daily. Reubin Milan, MD Taking Active   Cholecalciferol (VITAMIN D) 50 MCG (2000 UT) CAPS [id number] Yes Take by mouth. [provider] Taking Active   clobetasol cream (TEMOVATE) 0.05 % [id number] Yes Apply 1 Application topically 2 (two) times daily. Reubin Milan, MD Taking Active   denosumab [facility]) 60 MG/ML SOSY injection [id number] Yes Inject into the skin. q6  months [provider] Taking Active   famotidine (PEPCID) 20 MG tablet [id number] Yes Take 1 tablet (20 mg total) by mouth 2 (two) times daily. Irean Hong, MD Taking Active   gabapentin (NEURONTIN) 300 MG capsule [id number] Yes Take 300 mg by mouth 3 (three) times daily. [provider] Taking Active   magnesium oxide (MAG-OX) 400 MG tablet [id number] Yes Take by mouth. [provider] Taking Active   Melatonin 10 MG CAPS [id number] Yes Take by mouth. [provider] Taking Active   nortriptyline (PAMELOR) 25 MG capsule [id number] Yes Take 25 mg by mouth 2 (two) times daily. [provider] Taking Active   omeprazole (PRILOSEC) 20 MG capsule [id number] Yes Take 1 capsule (20 mg total) by mouth daily. Reubin Milan, MD Taking Active   sucralfate (CARAFATE) 1 GM/10ML suspension [id number] Yes Take 10 mLs (1 g total) by mouth 4 (four) times daily. Irean Hong, MD Taking Active             Home Care and Equipment/Supplies: Were Home Health Services Ordered?: NA Any new equipment or medical supplies ordered?: NA  Functional Questionnaire: Do you need assistance with bathing/showering or dressing?: No Do you need assistance with meal preparation?: No Do you need assistance with eating?: No Do you have difficulty maintaining continence: No Do you need assistance with getting out of bed/getting out of a chair/moving?: No Do you have difficulty managing or taking your medications?: No  Follow up appointments reviewed: PCP Follow-up appointment confirmed?: NA Specialist Hospital Follow-up appointment confirmed?: No Reason Specialist Follow-Up Not Confirmed: Patient has Specialist Provider Number and will Call for Appointment Do you need transportation to your follow-up appointment?: No Do  you understand care options if your condition(s) worsen?: Yes-patient verbalized understanding  SDOH Interventions Today    Flowsheet Row Most Recent Value   SDOH Interventions   Food Insecurity Interventions Intervention Not Indicated  Housing Interventions Intervention Not Indicated  Transportation Interventions Intervention Not Indicated      Interventions Today    Flowsheet Row Most Recent Value  General Interventions   General Interventions Discussed/Reviewed General Interventions Discussed, General Interventions Reviewed, Doctor Visits  Doctor Visits Discussed/Reviewed Doctor Visits Discussed, Doctor Visits Reviewed, Specialist  PCP/Specialist Visits Compliance with follow-up visit  Nutrition Interventions   Nutrition Discussed/Reviewed Nutrition Discussed, Nutrition Reviewed  Pharmacy Interventions   Pharmacy Dicussed/Reviewed Pharmacy Topics Discussed, Pharmacy Topics Reviewed  [RN reviewed medications. Patient had not looked over the discharge summary]      TOC Interventions Today    Flowsheet Row Most Recent Value  TOC Interventions   TOC Interventions Discussed/Reviewed TOC Interventions Discussed, TOC Interventions Reviewed       Gean Maidens BSN RN [facility] [phone]

## 2023-02-07 ENCOUNTER — Other Ambulatory Visit: Payer: Self-pay | Admitting: Orthopedic Surgery

## 2023-02-07 DIAGNOSIS — M461 Sacroiliitis, not elsewhere classified: Secondary | ICD-10-CM

## 2023-02-14 ENCOUNTER — Ambulatory Visit
Admission: RE | Admit: 2023-02-14 | Discharge: 2023-02-14 | Disposition: A | Discharge status: Home / Self Care | Payer: Medicare Other | Source: Ambulatory Visit | Attending: Orthopedic Surgery | Admitting: Orthopedic Surgery

## 2023-02-14 DIAGNOSIS — M461 Sacroiliitis, not elsewhere classified: Secondary | ICD-10-CM | POA: Diagnosis not present

## 2023-05-08 IMAGING — MG MM DIGITAL SCREENING BILAT W/ TOMO AND CAD
8 series · 8 of 24 positions shown · non-contrast
Comparison: Previous exam(s).

CLINICAL DATA: Screening.

EXAM:
DIGITAL SCREENING BILATERAL MAMMOGRAM WITH TOMOSYNTHESIS AND CAD
TECHNIQUE: Bilateral screening digital craniocaudal and mediolateral oblique
mammograms were obtained. Bilateral screening digital breast
tomosynthesis was performed. The images were evaluated with
computer-aided detection.

[R MLO synth-2D]
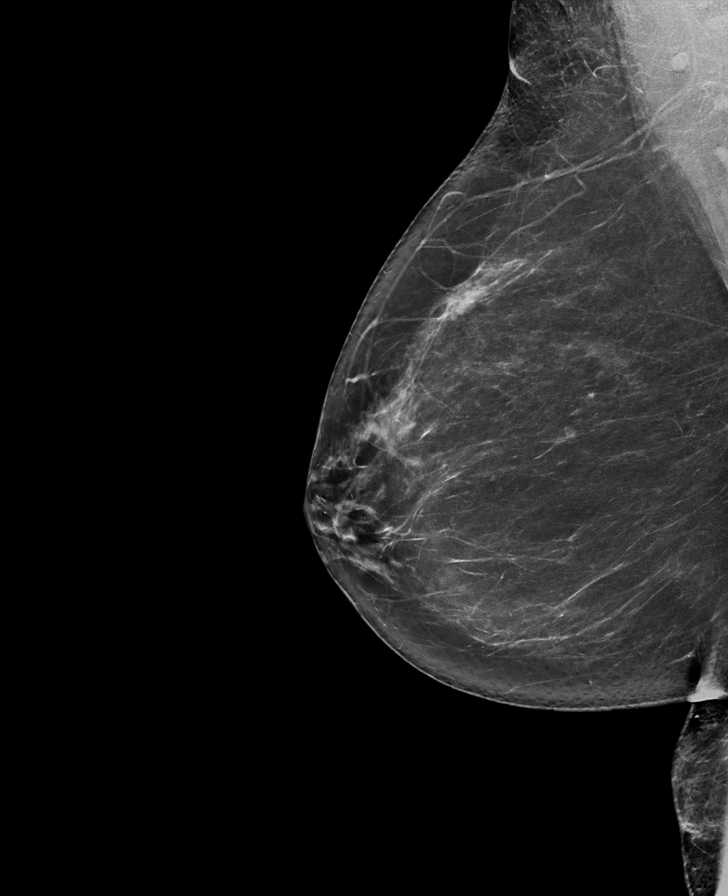

[R CC synth-2D]
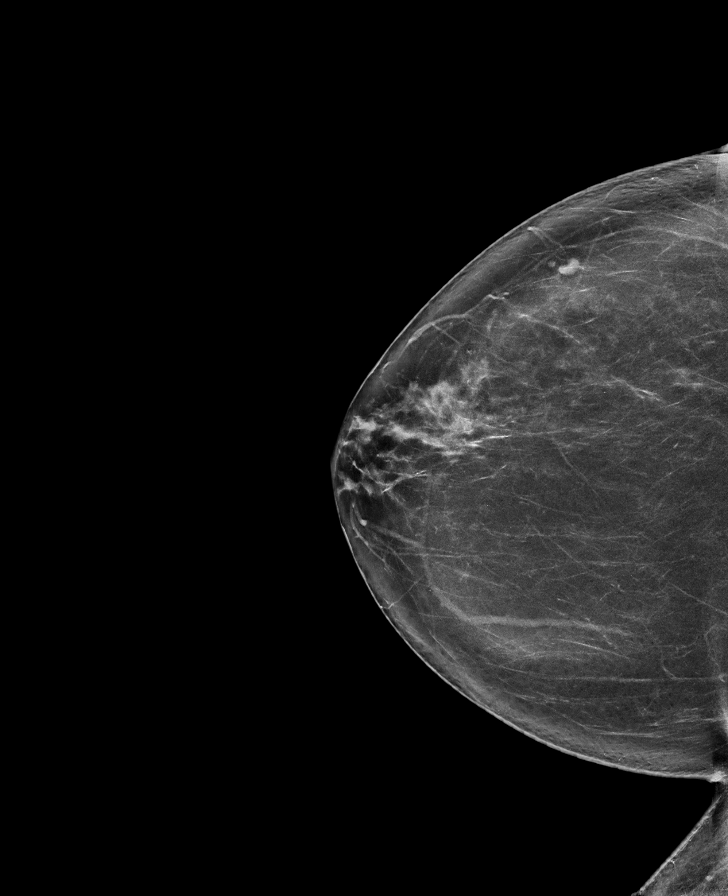

[L CC synth-2D]
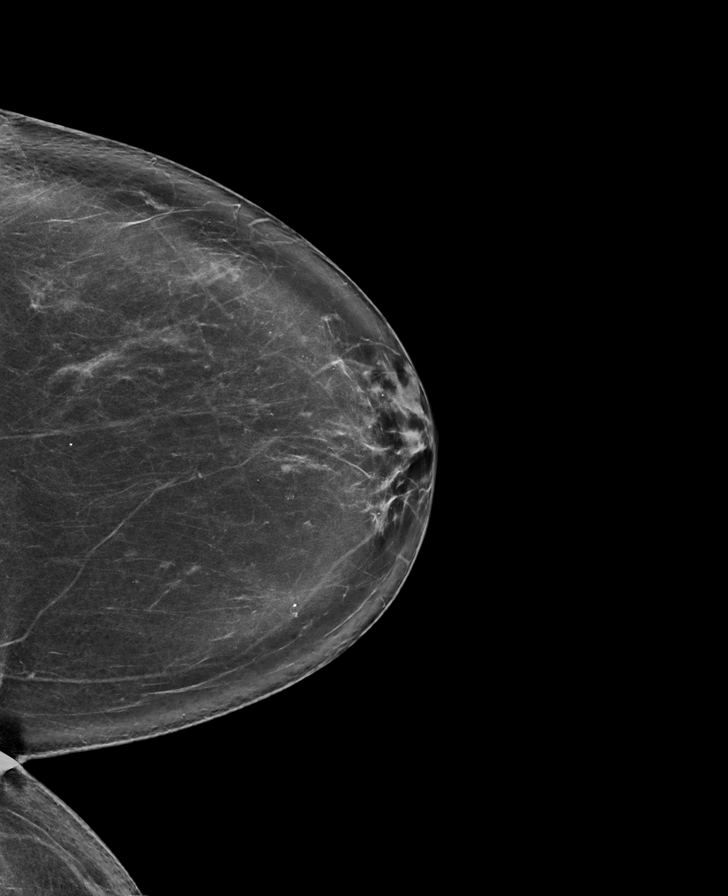

[L MLO synth-2D]
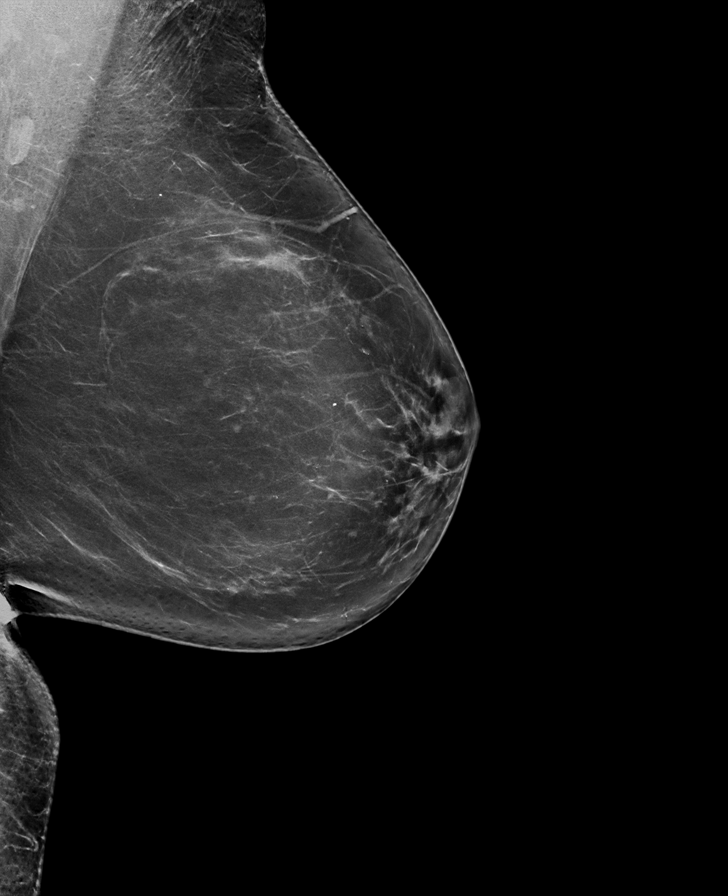

[L CC tomo · tomo slice 42/83.0]
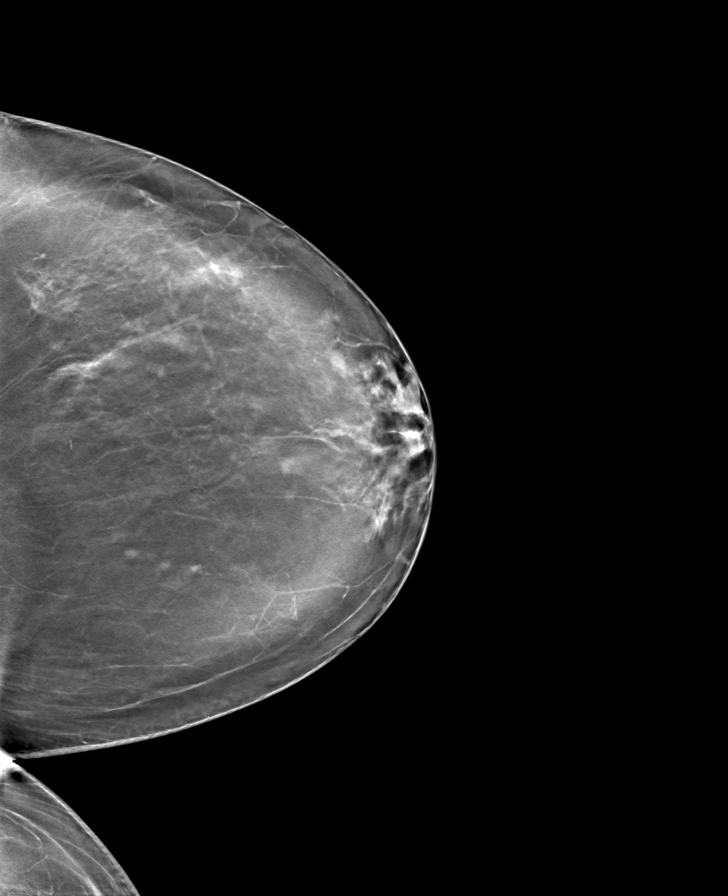

[L MLO tomo · tomo slice 45/89.0]
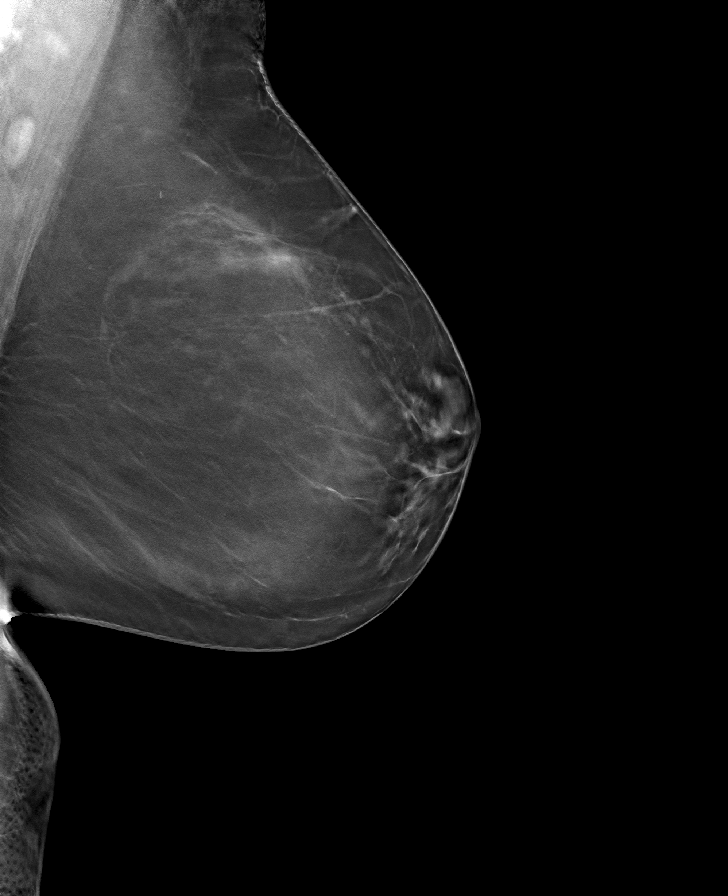

[R CC tomo · tomo slice 42/83.0]
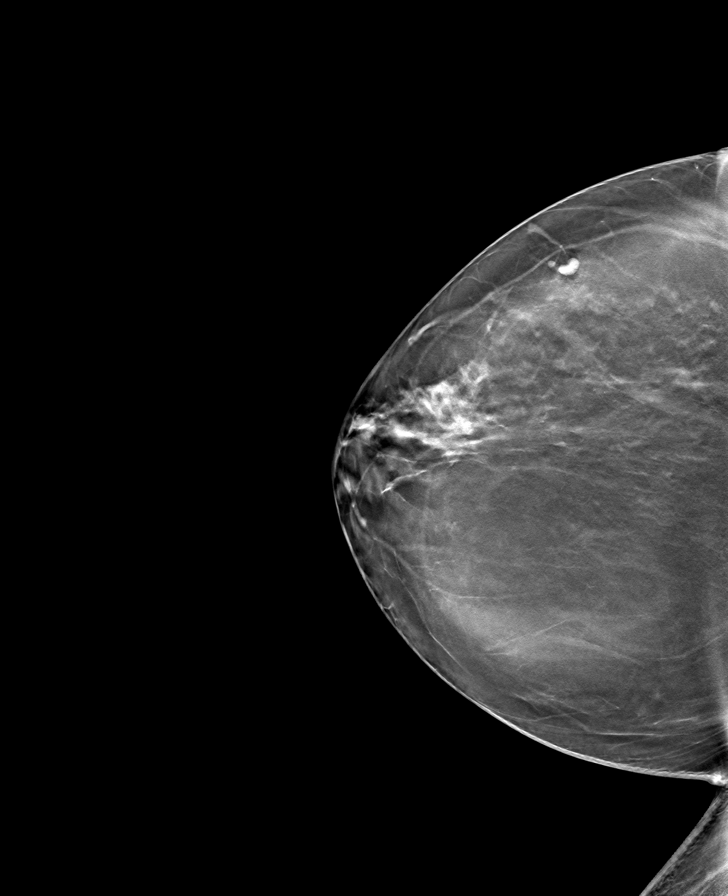

[R MLO tomo · tomo slice 41/82.0]
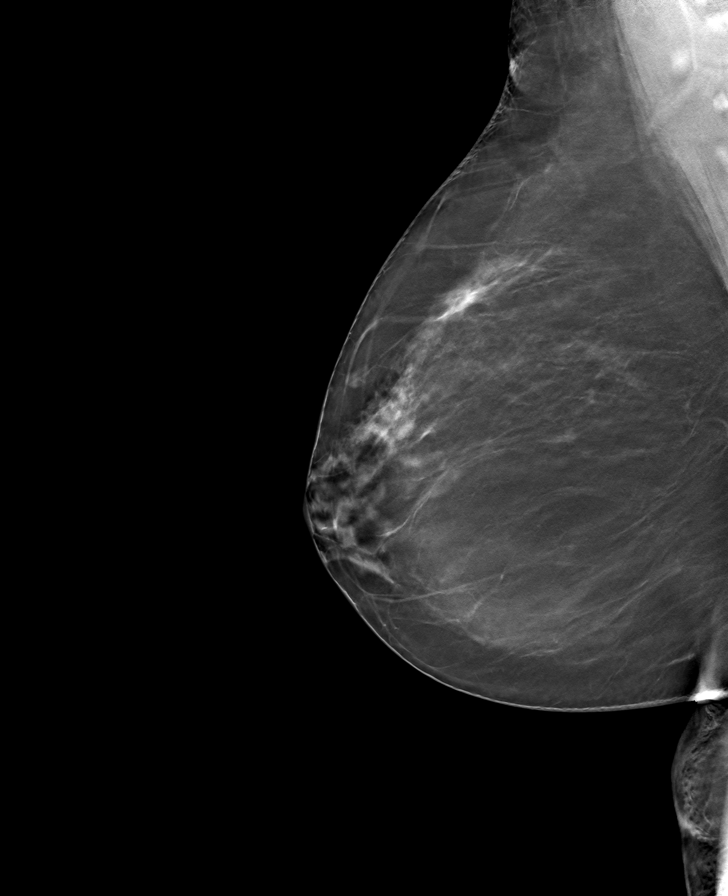

[8 of 24 positions shown; findings below may reference images not displayed]

ACR Breast Density Category b: There are scattered areas of
fibroglandular density.
FINDINGS: There are no findings suspicious for malignancy.
IMPRESSION: No mammographic evidence of malignancy. A result letter of this
screening mammogram will be mailed directly to the patient.

RECOMMENDATION:
Screening mammogram in one year. (Code:51-O-LD2)

BI-RADS CATEGORY  1: Negative.

## 2023-05-22 ENCOUNTER — Ambulatory Visit: Payer: Medicare Other

## 2023-05-22 DIAGNOSIS — Z780 Asymptomatic menopausal state: Secondary | ICD-10-CM | POA: Diagnosis not present

## 2023-05-22 DIAGNOSIS — Z0000 Encounter for general adult medical examination without abnormal findings: Secondary | ICD-10-CM

## 2023-05-22 NOTE — Patient Instructions (Addendum)
Roberta Ward , Thank you for taking time to come for your Medicare Wellness Visit. I appreciate your ongoing commitment to your health goals. Please review the following plan we discussed and let me know if I can assist you in the future.   Referrals/Orders/Follow-Ups/Clinician Recommendations:  referral for bone density scan sent  This is a list of the screening recommended for you and due dates:  Health Maintenance  Topic Date Due   DTaP/Tdap/Td vaccine (1 - Tdap) Never done   Zoster (Shingles) Vaccine (1 of 2) Never done   COVID-19 Vaccine (7 - 2023-24 season) 03/24/2023   Flu Shot  10/21/2023*   Mammogram  12/10/2023   Medicare Annual Wellness Visit  05/21/2024   Pneumonia Vaccine (3 of 3 - PPSV23 or PCV20) 09/20/2025   Colon Cancer Screening  11/23/2026   DEXA scan (bone density measurement)  Completed   Hepatitis C Screening  Completed   HPV Vaccine  Aged Out  *Topic was postponed. The date shown is not the original due date.    Advanced directives: (ACP Link)Information on Advanced Care Planning can be found at [facility] of [facility] Directives Advance Health Care Directives ([email or web address]    Next Medicare Annual Wellness Visit scheduled for next year: Yes   06/03/24 @ 1:45 pm by video

## 2023-05-22 NOTE — Progress Notes (Signed)
Subjective:   Roberta Ward is a 67 y.o. female who presents for Medicare Annual (Subsequent) preventive examination.  Visit Complete: Virtual I connected with  [name] on 05/22/23 by a audio enabled telemedicine application and verified that I am speaking with the correct person using two identifiers.  Patient Location: Home  Provider Location: Office/Clinic  I discussed the limitations of evaluation and management by telemedicine. The patient expressed understanding and agreed to proceed.  Vital Signs: Because this visit was a virtual/telehealth visit, some criteria may be missing or patient reported. Any vitals not documented were not able to be obtained and vitals that have been documented are patient reported.  Cardiac Risk Factors include: advanced age (>26men, >54 women);dyslipidemia;hypertension     Objective:    There were no vitals filed for this visit. There is no height or weight on file to calculate BMI.     05/22/2023    2:42 PM 12/18/2022    5:09 PM 05/16/2022    8:21 AM 05/15/2021    8:32 AM  Advanced Directives  Does Patient Have a Medical Advance Directive? No No No No  Would patient like information on creating a medical advance directive? No - Patient declined  No - Patient declined Yes (MAU/Ambulatory/Procedural Areas - Information given)    Current Medications (verified) Outpatient Encounter Medications as of 05/22/2023  Medication Sig   Ascorbic Acid (VITAMIN C) 1000 MG tablet Take 1,000 mg by mouth daily.   atenolol (TENORMIN) 25 MG tablet Take 1 tablet (25 mg total) by mouth daily.   Cholecalciferol (VITAMIN D) 50 MCG (2000 UT) CAPS Take by mouth.   clobetasol cream (TEMOVATE) 0.05 % Apply 1 Application topically 2 (two) times daily.   denosumab (PROLIA) 60 MG/ML SOSY injection Inject into the skin. q6 months   famotidine (PEPCID) 20 MG tablet Take 1 tablet (20 mg total) by mouth 2 (two) times daily.   gabapentin (NEURONTIN) 300 MG  capsule Take 300 mg by mouth 3 (three) times daily.   nortriptyline (PAMELOR) 25 MG capsule Take 25 mg by mouth 2 (two) times daily.   omeprazole (PRILOSEC) 20 MG capsule Take 1 capsule (20 mg total) by mouth daily.   magnesium oxide (MAG-OX) 400 MG tablet Take by mouth. (Patient not taking: Reported on 05/22/2023)   Melatonin 10 MG CAPS Take by mouth. (Patient not taking: Reported on 05/22/2023)   sucralfate (CARAFATE) 1 GM/10ML suspension Take 10 mLs (1 g total) by mouth 4 (four) times daily. (Patient not taking: Reported on 05/22/2023)   No facility-administered encounter medications on file as of 05/22/2023.    Allergies (verified) Sulfa antibiotics, Latex, and Talc   History: History reviewed. No pertinent past medical history. Past Surgical History:  Procedure Laterality Date   ABDOMINAL HYSTERECTOMY     KNEE CARTILAGE SURGERY Left    PARTIAL HYSTERECTOMY  1980   Rt ovary still present and cervix still present.   TONSILECTOMY/ADENOIDECTOMY WITH MYRINGOTOMY     Family History  Problem Relation Age of Onset   Breast cancer Mother    Breast cancer Maternal Aunt    Social History   Socioeconomic History   Marital status: Married    Spouse name: [place]    Number of children: 1   Years of education: Not on file   Highest education level: Not on file  Occupational History   Not on file  Tobacco Use   Smoking status: Never   Smokeless tobacco: Never  Vaping Use   Vaping status:  Never Used  Substance and Sexual Activity   Alcohol use: Yes    Comment: rare- occasional   Drug use: Never   Sexual activity: Not Currently  Other Topics Concern   Not on file  Social History Narrative   Not on file   Social Determinants of Health   Financial Resource Strain: Low Risk  (05/22/2023)   Overall Financial Resource Strain (CARDIA)    Difficulty of Paying Living Expenses: Not hard at all  Food Insecurity: No Food Insecurity (05/22/2023)   Hunger Vital Sign    Worried About  Running Out of Food in the Last Year: Never true    Ran Out of Food in the Last Year: Never true  Transportation Needs: No Transportation Needs (05/22/2023)   PRAPARE - Administrator, Civil Service (Medical): No    Lack of Transportation (Non-Medical): No  Physical Activity: Insufficiently Active (05/22/2023)   Exercise Vital Sign    Days of Exercise per Week: 3 days    Minutes of Exercise per Session: 30 min  Stress: No Stress Concern Present (05/22/2023)   [employer] of Occupational Health - Occupational Stress Questionnaire    Feeling of Stress : Not at all  Social Connections: Moderately Integrated (05/22/2023)   Social Connection and Isolation Panel [NHANES]    Frequency of Communication with Friends and Family: More than three times a week    Frequency of Social Gatherings with Friends and Family: Three times a week    Attends Religious Services: More than 4 times per year    Active Member of Clubs or Organizations: No    Attends Banker Meetings: Never    Marital Status: Married    Tobacco Counseling Counseling given: Not Answered   Clinical Intake:  Pre-visit preparation completed: Yes  Pain : No/denies pain     Nutritional Status: BMI 25 -29 Overweight Nutritional Risks: None Diabetes: No  How often do you need to have someone help you when you read instructions, pamphlets, or other written materials from your doctor or pharmacy?: 1 - Never  Interpreter Needed?: No  Information entered by :: Kennedy Bucker, LPN   Activities of Daily Living    05/22/2023    2:43 PM  In your present state of health, do you have any difficulty performing the following activities:  Hearing? 0  Vision? 0  Difficulty concentrating or making decisions? 0  Walking or climbing stairs? 1  Comment knee pain  Dressing or bathing? 0  Doing errands, shopping? 0  Preparing Food and eating ? N  Using the Toilet? N  In the past six months, have you  accidently leaked urine? N  Do you have problems with loss of bowel control? N  Managing your Medications? N  Managing your Finances? N  Housekeeping or managing your Housekeeping? N    Patient Care Team: Reubin Milan, MD as PCP - General (Internal Medicine) Tedd Sias Marlana Salvage, MD as Physician Assistant (Endocrinology) Patterson Hammersmith, MD as Consulting Physician (Rheumatology) Jerrel Ivory, DO as Consulting Physician (Sports Medicine) Elijah Birk, MD as Referring Physician (Physical Medicine and Rehabilitation) Pa, [facility] (Optometry)  Indicate any recent Medical Services you may have received from other than [facility] providers in the past year (date may be approximate).     Assessment:   This is a routine wellness examination for [place].  Hearing/Vision screen Hearing Screening - Comments:: No aids Vision Screening - Comments:: Wears glasses- [facility]  Goals Addressed             This Visit's Progress    DIET - EAT MORE FRUITS AND VEGETABLES         Depression Screen    05/22/2023    2:40 PM 12/12/2022    8:31 AM 09/13/2022   11:01 AM 08/29/2022    9:50 AM 07/12/2022    8:06 AM 05/16/2022    8:19 AM 01/10/2022    1:54 PM  PHQ 2/9 Scores  PHQ - 2 Score 0 0 0 0 0 0 0  PHQ- 9 Score 0 5 3 5 5 4 2     Fall Risk    05/22/2023    2:43 PM 12/12/2022    8:31 AM 09/13/2022   11:01 AM 08/29/2022    9:50 AM 07/12/2022    8:06 AM  Fall Risk   Falls in the past year? 1 1 0 0 0  Number falls in past yr: 0 1 0 0 0  Injury with Fall? 0 1 0 0 0  Risk for fall due to : History of fall(s) History of fall(s) No Fall Risks No Fall Risks No Fall Risks  Follow up Falls prevention discussed;Falls evaluation completed Falls evaluation completed Falls evaluation completed Falls evaluation completed Falls evaluation completed    MEDICARE RISK AT HOME: Medicare Risk at Home Any stairs in or around the home?: Yes If so, are there any without handrails?: No Home  free of loose throw rugs in walkways, pet beds, electrical cords, etc?: Yes Adequate lighting in your home to reduce risk of falls?: Yes Life alert?: No Use of a cane, walker or w/c?: No Grab bars in the bathroom?: No Shower chair or bench in shower?: No Elevated toilet seat or a handicapped toilet?: No  TIMED UP AND GO:  Was the test performed?  No    Cognitive Function:        05/22/2023    2:45 PM 05/16/2022    8:22 AM  6CIT Screen  What Year? 0 points 0 points  What month? 0 points 0 points  What time? 0 points 0 points  Count back from 20 0 points 0 points  Months in reverse 0 points 0 points  Repeat phrase 0 points 0 points  Total Score 0 points 0 points    Immunizations Immunization History  Administered Date(s) Administered   Fluad Quad(high Dose 65+) 07/05/2020   Influenza, High Dose Seasonal PF 04/23/2021   Influenza-Unspecified 04/25/2022   PFIZER(Purple Top)SARS-COV-2 Vaccination 09/21/2019, 10/11/2019, 04/30/2020, 11/01/2020, 04/25/2022   Pfizer Covid-19 Vaccine Bivalent Booster 44yrs & up 04/23/2021   Pneumococcal Conjugate-13 09/20/2020   Pneumococcal Polysaccharide-23 02/28/2019    TDAP status: Due, Education has been provided regarding the importance of this vaccine. Advised may receive this vaccine at local pharmacy or Health Dept. Aware to provide a copy of the vaccination record if obtained from local pharmacy or Health Dept. Verbalized acceptance and understanding.  Flu Vaccine status: Declined, Education has been provided regarding the importance of this vaccine but patient still declined. Advised may receive this vaccine at local pharmacy or Health Dept. Aware to provide a copy of the vaccination record if obtained from local pharmacy or Health Dept. Verbalized acceptance and understanding.  Pneumococcal vaccine status: Up to date  Covid-19 vaccine status: Completed vaccines  Qualifies for Shingles Vaccine? Yes   Zostavax completed No    Shingrix Completed?: No.    Education has been provided regarding the importance of this vaccine.  Patient has been advised to call insurance company to determine out of pocket expense if they have not yet received this vaccine. Advised may also receive vaccine at local pharmacy or Health Dept. Verbalized acceptance and understanding.  Screening Tests Health Maintenance  Topic Date Due   DTaP/Tdap/Td (1 - Tdap) Never done   Zoster Vaccines- Shingrix (1 of 2) Never done   COVID-19 Vaccine (7 - 2023-24 season) 03/24/2023   INFLUENZA VACCINE  10/21/2023 (Originally 02/21/2023)   MAMMOGRAM  12/10/2023   Medicare Annual Wellness (AWV)  05/21/2024   Pneumonia Vaccine 17+ Years old (3 of 3 - PPSV23 or PCV20) 09/20/2025   Colonoscopy  11/23/2026   DEXA SCAN  Completed   Hepatitis C Screening  Completed   HPV VACCINES  Aged Out    Health Maintenance  Health Maintenance Due  Topic Date Due   DTaP/Tdap/Td (1 - Tdap) Never done   Zoster Vaccines- Shingrix (1 of 2) Never done   COVID-19 Vaccine (7 - 2023-24 season) 03/24/2023    Colorectal cancer screening: Type of screening: Colonoscopy. Completed 11/22/16. Repeat every 10 years  Mammogram status: Completed 12/10/22. Repeat every year  Bone Density status: Completed 09/12/20. Results reflect: Bone density results: OSTEOPOROSIS. Repeat every 2 years.  Lung Cancer Screening: (Low Dose CT Chest recommended if Age 26-80 years, 20 pack-year currently smoking OR have quit w/in 15years.) does not qualify.   Additional Screening:  Hepatitis C Screening: does qualify; Completed 07/05/20  Vision Screening: Recommended annual ophthalmology exams for early detection of glaucoma and other disorders of the eye. Is the patient up to date with their annual eye exam?  Yes  Who is the provider or what is the name of the office in which the patient attends annual eye exams? [facility] If pt is not established with a provider, would they like to be referred  to a provider to establish care? No .   Dental Screening: Recommended annual dental exams for proper oral hygiene   Community Resource Referral / Chronic Care Management: CRR required this visit?  No   CCM required this visit?  No     Plan:     I have personally reviewed and noted the following in the patient's chart:   Medical and social history Use of alcohol, tobacco or illicit drugs  Current medications and supplements including opioid prescriptions. Patient is not currently taking opioid prescriptions. Functional ability and status Nutritional status Physical activity Advanced directives List of other physicians Hospitalizations, surgeries, and ER visits in previous 12 months Vitals Screenings to include cognitive, depression, and falls Referrals and appointments  In addition, I have reviewed and discussed with patient certain preventive protocols, quality metrics, and best practice recommendations. A written personalized care plan for preventive services as well as general preventive health recommendations were provided to patient.     Hal Hope, LPN   52/84/1324   After Visit Summary: (MyChart) Due to this being a telephonic visit, the after visit summary with patients personalized plan was offered to patient via MyChart   Nurse Notes: none

## 2023-06-18 ENCOUNTER — Encounter: Payer: Medicare Other | Admitting: Internal Medicine

## 2023-06-27 ENCOUNTER — Ambulatory Visit (INDEPENDENT_AMBULATORY_CARE_PROVIDER_SITE_OTHER): Payer: Medicare Other | Admitting: Internal Medicine

## 2023-06-27 ENCOUNTER — Encounter: Payer: Self-pay | Admitting: Internal Medicine

## 2023-06-27 VITALS — BP 128/78 | HR 74 | Ht 62.0 in | Wt 144.0 lb

## 2023-06-27 DIAGNOSIS — R7303 Prediabetes: Secondary | ICD-10-CM

## 2023-06-27 DIAGNOSIS — K219 Gastro-esophageal reflux disease without esophagitis: Secondary | ICD-10-CM | POA: Diagnosis not present

## 2023-06-27 DIAGNOSIS — E785 Hyperlipidemia, unspecified: Secondary | ICD-10-CM

## 2023-06-27 DIAGNOSIS — Z23 Encounter for immunization: Secondary | ICD-10-CM

## 2023-06-27 DIAGNOSIS — I10 Essential (primary) hypertension: Secondary | ICD-10-CM

## 2023-06-27 DIAGNOSIS — M8000XD Age-related osteoporosis with current pathological fracture, unspecified site, subsequent encounter for fracture with routine healing: Secondary | ICD-10-CM | POA: Diagnosis not present

## 2023-06-27 DIAGNOSIS — M545 Low back pain, unspecified: Secondary | ICD-10-CM

## 2023-06-27 DIAGNOSIS — R251 Tremor, unspecified: Secondary | ICD-10-CM

## 2023-06-27 NOTE — Assessment & Plan Note (Signed)
Reflux symptoms are minimal on current therapy - omeprazole. No red flag signs such as weight loss, n/v, melena  

## 2023-06-27 NOTE — Assessment & Plan Note (Addendum)
On Prolia from Endocrinology. Vitamin D borderline low last check DEXA due

## 2023-06-27 NOTE — Assessment & Plan Note (Signed)
Managed with diet. Lab Results  Component Value Date   HGBA1C 6.0 (H) 07/12/2022

## 2023-06-27 NOTE — Assessment & Plan Note (Signed)
Lipids managed with diet alone. Lab Results  Component Value Date   LDLCALC 141 (H) 07/12/2022

## 2023-06-27 NOTE — Assessment & Plan Note (Signed)
Controlled BP with normal exam. Current regimen is atenolol. Will continue same medications; encourage continued reduced sodium diet.

## 2023-06-27 NOTE — Assessment & Plan Note (Signed)
Being followed by [facility] On gabapentin with minimal benefit

## 2023-06-27 NOTE — Progress Notes (Signed)
Date:  06/27/2023   Name:  Roberta Ward   DOB:  09-04-1955   MRN:  [id number]   Chief Complaint: Annual Exam Roberta Ward is a 67 y.o. female who presents today for her Complete Annual Exam. She feels well. She reports exercising none. She reports she is sleeping poorly. Breast complaints none.  Mammogram: 11/2022 DEXA: 08/2020 Colonoscopy: 11/2016 repeat 10 yrs  Health Maintenance Due  Topic Date Due   DTaP/Tdap/Td (1 - Tdap) Never done   Zoster Vaccines- Shingrix (1 of 2) Never done    Immunization History  Administered Date(s) Administered   Fluad Quad(high Dose 65+) 07/05/2020   Influenza, High Dose Seasonal PF 04/23/2021   Influenza-Unspecified 04/25/2022, 06/17/2023   PFIZER(Purple Top)SARS-COV-2 Vaccination 09/21/2019, 10/11/2019, 04/30/2020, 11/01/2020, 04/25/2022   PNEUMOCOCCAL CONJUGATE-20 06/27/2023   Pfizer Covid-19 Vaccine Bivalent Booster 90yrs & up 04/23/2021   Pfizer(Comirnaty)Fall Seasonal Vaccine 12 years and older 06/17/2023   Pneumococcal Conjugate-13 09/20/2020   Pneumococcal Polysaccharide-23 02/28/2019     Hypertension This is a chronic problem. The problem is controlled. Pertinent negatives include no chest pain, headaches, palpitations or shortness of breath. Past treatments include beta blockers. The current treatment provides significant improvement. There is no history of kidney disease, CAD/MI or CVA.  Gastroesophageal Reflux She complains of heartburn. She reports no abdominal pain, no chest pain, no coughing or no wheezing. This is a recurrent problem. The problem occurs occasionally. Pertinent negatives include no fatigue. She has tried a PPI and a histamine-2 antagonist for the symptoms.  Tremor - minimal benefit from gabapentin.  Will follow up with Neurology. RLS - improved with gabapentin, sleep is better as well. Back pain - chronic, recurrent symptoms.  Followed by [facility].   Review of Systems  Constitutional:  Negative for  chills, fatigue and fever.  HENT:  Negative for congestion, hearing loss, tinnitus, trouble swallowing and voice change.   Eyes:  Negative for visual disturbance.  Respiratory:  Negative for cough, chest tightness, shortness of breath and wheezing.   Cardiovascular:  Negative for chest pain, palpitations and leg swelling.  Gastrointestinal:  Positive for heartburn. Negative for abdominal pain, constipation, diarrhea and vomiting.  Endocrine: Negative for polydipsia and polyuria.  Genitourinary:  Negative for dysuria, frequency, genital sores, vaginal bleeding and vaginal discharge.  Musculoskeletal:  Positive for arthralgias and back pain. Negative for gait problem and joint swelling.  Skin:  Negative for color change and rash.  Neurological:  Positive for tremors. Negative for dizziness, light-headedness and headaches.  Hematological:  Negative for adenopathy. Does not bruise/bleed easily.  Psychiatric/Behavioral:  Positive for sleep disturbance. Negative for dysphoric mood. The patient is not nervous/anxious.      Lab Results  Component Value Date   NA 139 12/18/2022   K 4.0 12/18/2022   CO2 24 12/18/2022   GLUCOSE 109 (H) 12/18/2022   BUN 22 12/18/2022   CREATININE 0.78 12/18/2022   CALCIUM 10.7 (H) 12/18/2022   EGFR 95 07/12/2022   GFRNONAA >60 12/18/2022   Lab Results  Component Value Date   CHOL 219 (H) 07/12/2022   HDL 55 07/12/2022   LDLCALC 141 (H) 07/12/2022   TRIG 129 07/12/2022   CHOLHDL 4.0 07/12/2022   Lab Results  Component Value Date   TSH 2.380 07/12/2022   Lab Results  Component Value Date   HGBA1C 6.0 (H) 07/12/2022   Lab Results  Component Value Date   WBC 10.1 12/18/2022   HGB 15.2 (H) 12/18/2022   HCT 45.4 12/18/2022  MCV 99.3 12/18/2022   PLT 314 12/18/2022   Lab Results  Component Value Date   ALT 53 (H) 12/18/2022   AST 39 12/18/2022   ALKPHOS 43 12/18/2022   BILITOT 0.9 12/18/2022   Lab Results  Component Value Date   VD25OH 32.1  07/10/2021     Patient Active Problem List   Diagnosis Date Noted   Restless legs syndrome (RLS) 07/12/2022   Cervical spondylosis 02/26/2022   Lumbar radiculopathy 02/05/2022   Essential hypertension 01/10/2022   Derangement of right shoulder joint 10/30/2021   Tremor 02/03/2021   Episode of moderate major depression (HCC) 02/03/2021   Polyarthralgia 02/03/2021   Gastroesophageal reflux disease 09/20/2020   Mild hyperlipidemia 07/06/2020   Prediabetes 04/01/2020   Closed compression fracture of L2 lumbar vertebra, initial encounter (HCC) 03/02/2020   Age-related osteoporosis with current pathological fracture 03/01/2020   Primary insomnia 03/01/2020   Primary osteoarthritis of left knee 03/01/2020   Lumbar back pain 03/01/2020   Patient is Jehovah's Witness 03/01/2020   Colon polyp 08/14/2016   Vitamin D deficiency 01/25/2016    Allergies  Allergen Reactions   Sulfa Antibiotics Hives and Itching   Latex    Talc     Other reaction(s): Angioedema    Past Surgical History:  Procedure Laterality Date   ABDOMINAL HYSTERECTOMY     KNEE CARTILAGE SURGERY Left    PARTIAL HYSTERECTOMY  1980   Rt ovary still present and cervix still present.   TONSILECTOMY/ADENOIDECTOMY WITH MYRINGOTOMY      Social History   Tobacco Use   Smoking status: Never   Smokeless tobacco: Never  Vaping Use   Vaping status: Never Used  Substance Use Topics   Alcohol use: Yes    Comment: rare- occasional   Drug use: Never     Medication list has been reviewed and updated.  Current Meds  Medication Sig   Ascorbic Acid (VITAMIN C) 1000 MG tablet Take 1,000 mg by mouth daily.   atenolol (TENORMIN) 25 MG tablet Take 1 tablet (25 mg total) by mouth daily.   Cholecalciferol (VITAMIN D) 50 MCG (2000 UT) CAPS Take by mouth.   clobetasol cream (TEMOVATE) 0.05 % Apply 1 Application topically 2 (two) times daily.   denosumab (PROLIA) 60 MG/ML SOSY injection Inject into the skin. q6 months    gabapentin (NEURONTIN) 300 MG capsule Take 300 mg by mouth 3 (three) times daily.   nortriptyline (PAMELOR) 25 MG capsule Take 25 mg by mouth 2 (two) times daily.   omeprazole (PRILOSEC) 20 MG capsule Take 1 capsule (20 mg total) by mouth daily.   [DISCONTINUED] famotidine (PEPCID) 20 MG tablet Take 1 tablet (20 mg total) by mouth 2 (two) times daily.       06/27/2023    8:00 AM 12/12/2022    8:31 AM 09/13/2022   11:01 AM 08/29/2022    9:50 AM  GAD 7 : Generalized Anxiety Score  Nervous, Anxious, on Edge 0 0 0 0  Control/stop worrying 0 0 0 0  Worry too much - different things 0 0 0 0  Trouble relaxing 2 0 0 0  Restless 0 0 0 0  Easily annoyed or irritable 0 0 0 0  Afraid - awful might happen 1 0 0 0  Total GAD 7 Score 3 0 0 0  Anxiety Difficulty Not difficult at all Not difficult at all Not difficult at all Not difficult at all       06/27/2023    8:00  AM 05/22/2023    2:40 PM 12/12/2022    8:31 AM  Depression screen PHQ 2/9  Decreased Interest 0 0 0  Down, Depressed, Hopeless 0 0 0  PHQ - 2 Score 0 0 0  Altered sleeping 3 0 3  Tired, decreased energy 0 0 2  Change in appetite 0 0 0  Feeling bad or failure about yourself  0 0 0  Trouble concentrating 0 0 0  Moving slowly or fidgety/restless 0 0 0  Suicidal thoughts 0 0 0  PHQ-9 Score 3 0 5  Difficult doing work/chores Not difficult at all Not difficult at all Not difficult at all    BP Readings from Last 3 Encounters:  06/27/23 128/78  12/19/22 132/62  12/12/22 128/72    Physical Exam Vitals and nursing note reviewed.  Constitutional:      General: She is not in acute distress.    Appearance: She is well-developed.  HENT:     Head: Normocephalic and atraumatic.     Right Ear: Tympanic membrane and ear canal normal.     Left Ear: Tympanic membrane and ear canal normal.     Nose:     Right Sinus: No maxillary sinus tenderness.     Left Sinus: No maxillary sinus tenderness.  Eyes:     General: No scleral icterus.        Right eye: No discharge.        Left eye: No discharge.     Conjunctiva/sclera: Conjunctivae normal.  Neck:     Thyroid: No thyromegaly.     Vascular: No carotid bruit.  Cardiovascular:     Rate and Rhythm: Normal rate and regular rhythm.     Pulses: Normal pulses.     Heart sounds: Normal heart sounds.  Pulmonary:     Effort: Pulmonary effort is normal. No respiratory distress.     Breath sounds: No wheezing.  Abdominal:     General: Bowel sounds are normal.     Palpations: Abdomen is soft.     Tenderness: There is no abdominal tenderness.  Musculoskeletal:     Cervical back: Normal range of motion. No erythema.     Right lower leg: No edema.     Left lower leg: No edema.  Lymphadenopathy:     Cervical: No cervical adenopathy.  Skin:    General: Skin is warm and dry.     Findings: No rash.  Neurological:     Mental Status: She is alert and oriented to person, place, and time.     Cranial Nerves: No cranial nerve deficit.     Sensory: No sensory deficit.     Motor: Tremor (in both UE) present.     Deep Tendon Reflexes: Reflexes are normal and symmetric.  Psychiatric:        Attention and Perception: Attention normal.        Mood and Affect: Mood normal.     Wt Readings from Last 3 Encounters:  06/27/23 144 lb (65.3 kg)  12/12/22 144 lb (65.3 kg)  09/13/22 142 lb 6.4 oz (64.6 kg)    BP 128/78 (BP Location: Left Arm, Cuff Size: Normal)   Pulse 74   Ht 5\' 2"  (1.575 m)   Wt 144 lb (65.3 kg)   SpO2 96%   BMI 26.34 kg/m   Assessment and Plan:  Problem List Items Addressed This Visit       Unprioritized   Age-related osteoporosis with current pathological fracture (Chronic)  On Prolia from Endocrinology. Vitamin D borderline low last check DEXA due      Relevant Orders   VITAMIN D 25 Hydroxy (Vit-D Deficiency, Fractures)   Prediabetes (Chronic)    Managed with diet. Lab Results  Component Value Date   HGBA1C 6.0 (H) 07/12/2022          Relevant Orders   Hemoglobin A1c   Mild hyperlipidemia (Chronic)    Lipids managed with diet alone. Lab Results  Component Value Date   LDLCALC 141 (H) 07/12/2022         Relevant Orders   Lipid panel   Gastroesophageal reflux disease (Chronic)    Reflux symptoms are minimal on current therapy - omeprazole. No red flag signs such as weight loss, n/v, melena       Relevant Orders   CBC with Differential/Platelet   Tremor (Chronic)    Currently on gabapentin for tremor and RLS but tremor is not much improved Followed by neurology      Lumbar back pain    Being followed by [facility] On gabapentin with minimal benefit      Essential hypertension - Primary    Controlled BP with normal exam. Current regimen is atenolol. Will continue same medications; encourage continued reduced sodium diet.       Relevant Orders   CBC with Differential/Platelet   Comprehensive metabolic panel   TSH   Urinalysis, Routine w reflex microscopic   Other Visit Diagnoses     Need for vaccination for pneumococcus       Relevant Orders   Pneumococcal conjugate vaccine 20-valent (Completed)       Return in about 6 months (around 12/26/2023) for HTN.    Reubin Milan, MD [facility] [place]

## 2023-06-27 NOTE — Assessment & Plan Note (Signed)
Currently on gabapentin for tremor and RLS but tremor is not much improved Followed by neurology

## 2023-06-27 NOTE — Patient Instructions (Signed)
Call [facility] to schedule your Bone Density at [phone].

## 2023-06-28 ENCOUNTER — Encounter: Payer: Self-pay | Admitting: Internal Medicine

## 2023-06-28 ENCOUNTER — Other Ambulatory Visit: Payer: Self-pay | Admitting: Internal Medicine

## 2023-06-28 DIAGNOSIS — R7989 Other specified abnormal findings of blood chemistry: Secondary | ICD-10-CM | POA: Insufficient documentation

## 2023-06-28 LAB — URINALYSIS, ROUTINE W REFLEX MICROSCOPIC
Bilirubin, UA: NEGATIVE
Glucose, UA: NEGATIVE
Ketones, UA: NEGATIVE
Leukocytes,UA: NEGATIVE
Nitrite, UA: NEGATIVE
RBC, UA: NEGATIVE
Specific Gravity, UA: 1.023 (ref 1.005–1.030)
Urobilinogen, Ur: 0.2 mg/dL (ref 0.2–1.0)
pH, UA: 8 — ABNORMAL HIGH (ref 5.0–7.5)

## 2023-06-28 LAB — COMPREHENSIVE METABOLIC PANEL
ALT: 81 [IU]/L — ABNORMAL HIGH (ref 0–32)
AST: 74 [IU]/L — ABNORMAL HIGH (ref 0–40)
Albumin: 4.7 g/dL (ref 3.9–4.9)
Alkaline Phosphatase: 63 [IU]/L (ref 44–121)
BUN/Creatinine Ratio: 28 (ref 12–28)
BUN: 17 mg/dL (ref 8–27)
Bilirubin Total: 0.5 mg/dL (ref 0.0–1.2)
CO2: 18 mmol/L — ABNORMAL LOW (ref 20–29)
Calcium: 9.5 mg/dL (ref 8.7–10.3)
Chloride: 105 mmol/L (ref 96–106)
Creatinine, Ser: 0.6 mg/dL (ref 0.57–1.00)
Globulin, Total: 2.3 g/dL (ref 1.5–4.5)
Glucose: 109 mg/dL — ABNORMAL HIGH (ref 70–99)
Potassium: 4.6 mmol/L (ref 3.5–5.2)
Sodium: 140 mmol/L (ref 134–144)
Total Protein: 7 g/dL (ref 6.0–8.5)
eGFR: 97 mL/min/{1.73_m2} (ref >59)

## 2023-06-28 LAB — LIPID PANEL
Chol/HDL Ratio: 4.7 {ratio} — ABNORMAL HIGH (ref 0.0–4.4)
Cholesterol, Total: 225 mg/dL — ABNORMAL HIGH (ref 100–199)
HDL: 48 mg/dL (ref >39)
LDL Chol Calc (NIH): 148 mg/dL — ABNORMAL HIGH (ref 0–99)
Triglycerides: 161 mg/dL — ABNORMAL HIGH (ref 0–149)
VLDL Cholesterol Cal: 29 mg/dL (ref 5–40)

## 2023-06-28 LAB — CBC WITH DIFFERENTIAL/PLATELET
Basophils Absolute: 0 10*3/uL (ref 0.0–0.2)
Basos: 1 %
EOS (ABSOLUTE): 0.5 10*3/uL — ABNORMAL HIGH (ref 0.0–0.4)
Eos: 7 %
Hematocrit: 43.5 % (ref 34.0–46.6)
Hemoglobin: 14.3 g/dL (ref 11.1–15.9)
Immature Grans (Abs): 0 10*3/uL (ref 0.0–0.1)
Immature Granulocytes: 1 %
Lymphocytes Absolute: 1.8 10*3/uL (ref 0.7–3.1)
Lymphs: 27 %
MCH: 32.3 pg (ref 26.6–33.0)
MCHC: 32.9 g/dL (ref 31.5–35.7)
MCV: 98 fL — ABNORMAL HIGH (ref 79–97)
Monocytes Absolute: 0.5 10*3/uL (ref 0.1–0.9)
Monocytes: 8 %
Neutrophils Absolute: 3.8 10*3/uL (ref 1.4–7.0)
Neutrophils: 56 %
Platelets: 269 10*3/uL (ref 150–450)
RBC: 4.43 x10E6/uL (ref 3.77–5.28)
RDW: 12.7 % (ref 11.7–15.4)
WBC: 6.6 10*3/uL (ref 3.4–10.8)

## 2023-06-28 LAB — HEMOGLOBIN A1C
Est. average glucose Bld gHb Est-mCnc: 128 mg/dL
Hgb A1c MFr Bld: 6.1 % — ABNORMAL HIGH (ref 4.8–5.6)

## 2023-06-28 LAB — TSH: TSH: 2.05 u[IU]/mL (ref 0.450–4.500)

## 2023-06-28 LAB — VITAMIN D 25 HYDROXY (VIT D DEFICIENCY, FRACTURES): Vit D, 25-Hydroxy: 31.9 ng/mL (ref 30.0–100.0)

## 2023-07-03 ENCOUNTER — Ambulatory Visit
Admission: RE | Admit: 2023-07-03 | Discharge: 2023-07-03 | Disposition: A | Discharge status: Home / Self Care | Payer: Medicare Other | Source: Ambulatory Visit | Attending: Internal Medicine | Admitting: Internal Medicine

## 2023-07-03 DIAGNOSIS — R7989 Other specified abnormal findings of blood chemistry: Secondary | ICD-10-CM | POA: Diagnosis present

## 2023-07-22 ENCOUNTER — Ambulatory Visit: Payer: Medicare Other | Admitting: Orthopedic Surgery

## 2023-10-03 ENCOUNTER — Ambulatory Visit: Payer: Self-pay | Admitting: Internal Medicine

## 2023-10-03 NOTE — Telephone Encounter (Addendum)
  Chief Complaint: vaginal sx Symptoms: burning, itching, foul smell, some discharge Frequency: x1 month Pertinent Negatives: Patient denies fever, urinary sx, vaginal pain Disposition: [] ED /[] Urgent Care (no appt availability in office) / [x] Appointment(In office/virtual)/ []  [facility]/ [] Home Care/ [] Refused Recommended Disposition /[] [facility]/ []  Follow-up with PCP Additional Notes: Pt c/o vaginal burning and itching x 1 month. Reports occasional discharge and foul smell. Attempted to see OBGYN, but unavailable until April. Was previously prescribed topical cream that helped with itching (needs refill), but also now has concerns for burning. Denies any fever, urinary sx, pain. Scheduled patient per protocol on 10/04/2023. Patient verbalized understanding and to call back with worsening symptoms.    Reason for Disposition  MODERATE-SEVERE itching (i.e., interferes with school, work, or sleep)  Answer Assessment - Initial Assessment Questions 1. SYMPTOM: "What's the main symptom you're concerned about?" (e.g., pain, itching, dryness)     Itching, burning 2. LOCATION: "Where is the  *No Answer* located?" (e.g., inside/outside, left/right)     vaginal 3. ONSET: "When did the  itching  start?"     X1 month internal and external 4. PAIN: "Is there any pain?" If Yes, ask: "How bad is it?" (Scale: 1-10; mild, moderate, severe)   -  MILD (1-3): Doesn't interfere with normal activities.    -  MODERATE (4-7): Interferes with normal activities (e.g., work or school) or awakens from sleep.     -  SEVERE (8-10): Excruciating pain, unable to do any normal activities.     no 5. ITCHING: "Is there any itching?" If Yes, ask: "How bad is it?" (Scale: 1-10; mild, moderate, severe)     10 6. CAUSE: "What do you think is causing the discharge?" "Have you had the same problem before? What happened then?"     Some discharge, clobetasol cream given with relief 7. OTHER SYMPTOMS:  "Do you have any other symptoms?" (e.g., fever, itching, vaginal bleeding, pain with urination, injury to genital area, vaginal foreign body)     Afebrile, vaginal burning 8. PREGNANCY: "Is there any chance you are pregnant?" "When was your last menstrual period?"     no  Protocols used: Vaginal Symptoms-A-AH

## 2023-10-04 ENCOUNTER — Ambulatory Visit (INDEPENDENT_AMBULATORY_CARE_PROVIDER_SITE_OTHER): Admitting: Internal Medicine

## 2023-10-04 ENCOUNTER — Other Ambulatory Visit (HOSPITAL_COMMUNITY)
Admission: RE | Admit: 2023-10-04 | Discharge: 2023-10-04 | Disposition: A | Discharge status: Home / Self Care | Source: Ambulatory Visit | Attending: Internal Medicine | Admitting: Internal Medicine

## 2023-10-04 ENCOUNTER — Encounter: Payer: Self-pay | Admitting: Internal Medicine

## 2023-10-04 VITALS — BP 124/78 | HR 84 | Ht 62.0 in | Wt 147.2 lb

## 2023-10-04 DIAGNOSIS — K219 Gastro-esophageal reflux disease without esophagitis: Secondary | ICD-10-CM | POA: Diagnosis not present

## 2023-10-04 DIAGNOSIS — R300 Dysuria: Secondary | ICD-10-CM | POA: Diagnosis not present

## 2023-10-04 DIAGNOSIS — N761 Subacute and chronic vaginitis: Secondary | ICD-10-CM

## 2023-10-04 DIAGNOSIS — F321 Major depressive disorder, single episode, moderate: Secondary | ICD-10-CM

## 2023-10-04 DIAGNOSIS — R251 Tremor, unspecified: Secondary | ICD-10-CM | POA: Diagnosis not present

## 2023-10-04 DIAGNOSIS — F5101 Primary insomnia: Secondary | ICD-10-CM

## 2023-10-04 LAB — POCT URINALYSIS DIPSTICK
Bilirubin, UA: NEGATIVE
Blood, UA: NEGATIVE
Glucose, UA: NEGATIVE
Ketones, UA: NEGATIVE
Leukocytes, UA: NEGATIVE
Nitrite, UA: NEGATIVE
Protein, UA: NEGATIVE
Spec Grav, UA: 1.02 (ref 1.010–1.025)
Urobilinogen, UA: 0.2 U/dL
pH, UA: 6.5 (ref 5.0–8.0)

## 2023-10-04 MED ORDER — NORTRIPTYLINE HCL 25 MG PO CAPS: 25.0000 mg | ORAL_CAPSULE | Freq: Every day | ORAL | 0 refills | Status: DC

## 2023-10-04 MED ORDER — ATENOLOL 25 MG PO TABS: 25.0000 mg | ORAL_TABLET | Freq: Every day | ORAL | 3 refills | Status: DC

## 2023-10-04 MED ORDER — CLOBETASOL PROPIONATE 0.05 % EX CREA: 1.0000 | TOPICAL_CREAM | Freq: Two times a day (BID) | CUTANEOUS | 1 refills | Status: DC

## 2023-10-04 MED ORDER — OMEPRAZOLE 20 MG PO CPDR: 20.0000 mg | DELAYED_RELEASE_CAPSULE | Freq: Every day | ORAL | 1 refills | Status: AC

## 2023-10-04 NOTE — Assessment & Plan Note (Signed)
 Refill nortriptyline

## 2023-10-04 NOTE — Assessment & Plan Note (Signed)
 Reflux symptoms are minimal on current therapy - omeprazole. No red flag signs such as weight loss, n/v, melena

## 2023-10-04 NOTE — Assessment & Plan Note (Signed)
 On gabapentin 900 mg nightly with some improvement

## 2023-10-04 NOTE — Progress Notes (Signed)
 Date:  10/04/2023   Name:  Roberta Ward   DOB:  Jul 22, 1956   MRN:  [id number]   Chief Complaint: Vaginal Itching (Patient said she has had vaginal odor, itching, burning with urination )  Vaginal Discharge The patient's primary symptoms include vaginal discharge. Associated symptoms include back pain and dysuria. Pertinent negatives include no chills.  Dysuria  Pertinent negatives include no chills.  Insomnia Primary symptoms: sleep disturbance.      Review of Systems  Constitutional:  Negative for chills and fatigue.  Respiratory:  Negative for chest tightness and shortness of breath.   Genitourinary:  Positive for dysuria and vaginal discharge.  Musculoskeletal:  Positive for back pain.  Psychiatric/Behavioral:  Positive for sleep disturbance. Negative for dysphoric mood. The patient has insomnia. The patient is not nervous/anxious.      Lab Results  Component Value Date   NA 140 06/27/2023   K 4.6 06/27/2023   CO2 18 (L) 06/27/2023   GLUCOSE 109 (H) 06/27/2023   BUN 17 06/27/2023   CREATININE 0.60 06/27/2023   CALCIUM 9.5 06/27/2023   EGFR 97 06/27/2023   GFRNONAA >60 12/18/2022   Lab Results  Component Value Date   CHOL 225 (H) 06/27/2023   HDL 48 06/27/2023   LDLCALC 148 (H) 06/27/2023   TRIG 161 (H) 06/27/2023   CHOLHDL 4.7 (H) 06/27/2023   Lab Results  Component Value Date   TSH 2.050 06/27/2023   Lab Results  Component Value Date   HGBA1C 6.1 (H) 06/27/2023   Lab Results  Component Value Date   WBC 6.6 06/27/2023   HGB 14.3 06/27/2023   HCT 43.5 06/27/2023   MCV 98 (H) 06/27/2023   PLT 269 06/27/2023   Lab Results  Component Value Date   ALT 81 (H) 06/27/2023   AST 74 (H) 06/27/2023   ALKPHOS 63 06/27/2023   BILITOT 0.5 06/27/2023   Lab Results  Component Value Date   VD25OH 31.9 06/27/2023     Patient Active Problem List   Diagnosis Date Noted   Elevated liver function tests 06/28/2023   Restless legs syndrome (RLS) 07/12/2022    Cervical spondylosis 02/26/2022   Lumbar radiculopathy 02/05/2022   Essential hypertension 01/10/2022   Derangement of right shoulder joint 10/30/2021   Tremor 02/03/2021   Episode of moderate major depression (HCC) 02/03/2021   Polyarthralgia 02/03/2021   Gastroesophageal reflux disease 09/20/2020   Mild hyperlipidemia 07/06/2020   Prediabetes 04/01/2020   Closed compression fracture of L2 lumbar vertebra, initial encounter (HCC) 03/02/2020   Age-related osteoporosis with current pathological fracture 03/01/2020   Primary insomnia 03/01/2020   Primary osteoarthritis of left knee 03/01/2020   Lumbar back pain 03/01/2020   Patient is Jehovah's Witness 03/01/2020   Colon polyp 08/14/2016   Vitamin D deficiency 01/25/2016    Allergies  Allergen Reactions   Sulfa Antibiotics Hives and Itching   Latex    Talc     Other reaction(s): Angioedema    Past Surgical History:  Procedure Laterality Date   ABDOMINAL HYSTERECTOMY     KNEE CARTILAGE SURGERY Left    PARTIAL HYSTERECTOMY  1980   Rt ovary still present and cervix still present.   TONSILECTOMY/ADENOIDECTOMY WITH MYRINGOTOMY      Social History   Tobacco Use   Smoking status: Never   Smokeless tobacco: Never  Vaping Use   Vaping status: Never Used  Substance Use Topics   Alcohol use: Yes    Comment: rare- occasional   Drug  use: Never     Medication list has been reviewed and updated.  Current Meds  Medication Sig   Ascorbic Acid (VITAMIN C) 1000 MG tablet Take 1,000 mg by mouth daily.   Cholecalciferol (VITAMIN D) 50 MCG (2000 UT) CAPS Take by mouth.   denosumab (PROLIA) 60 MG/ML SOSY injection Inject into the skin. q6 months   gabapentin (NEURONTIN) 300 MG capsule Take 300 mg by mouth 3 (three) times daily.   [DISCONTINUED] atenolol (TENORMIN) 25 MG tablet Take 1 tablet (25 mg total) by mouth daily.   [DISCONTINUED] clobetasol cream (TEMOVATE) 0.05 % Apply 1 Application topically 2 (two) times daily.    [DISCONTINUED] omeprazole (PRILOSEC) 20 MG capsule Take 1 capsule (20 mg total) by mouth daily.       10/04/2023    1:44 PM 06/27/2023    8:00 AM 12/12/2022    8:31 AM 09/13/2022   11:01 AM  GAD 7 : Generalized Anxiety Score  Nervous, Anxious, on Edge 0 0 0 0  Control/stop worrying 0 0 0 0  Worry too much - different things 0 0 0 0  Trouble relaxing 0 2 0 0  Restless 0 0 0 0  Easily annoyed or irritable 0 0 0 0  Afraid - awful might happen 0 1 0 0  Total GAD 7 Score 0 3 0 0  Anxiety Difficulty Not difficult at all Not difficult at all Not difficult at all Not difficult at all       10/04/2023    1:44 PM 06/27/2023    8:00 AM 05/22/2023    2:40 PM  Depression screen PHQ 2/9  Decreased Interest 0 0 0  Down, Depressed, Hopeless 0 0 0  PHQ - 2 Score 0 0 0  Altered sleeping 3 3 0  Tired, decreased energy 0 0 0  Change in appetite 0 0 0  Feeling bad or failure about yourself  0 0 0  Trouble concentrating 0 0 0  Moving slowly or fidgety/restless 0 0 0  Suicidal thoughts 0 0 0  PHQ-9 Score 3 3 0  Difficult doing work/chores Not difficult at all Not difficult at all Not difficult at all    BP Readings from Last 3 Encounters:  10/04/23 124/78  06/27/23 128/78  12/19/22 132/62    Physical Exam Vitals and nursing note reviewed.  Constitutional:      General: She is not in acute distress.    Appearance: She is well-developed.  HENT:     Head: Normocephalic and atraumatic.  Pulmonary:     Effort: Pulmonary effort is normal. No respiratory distress.  Skin:    General: Skin is warm and dry.     Findings: No rash.  Neurological:     Mental Status: She is alert and oriented to person, place, and time.  Psychiatric:        Mood and Affect: Mood normal.        Behavior: Behavior normal.     Lab Results  Component Value Date   COLORU yellow 10/04/2023   CLARITYU clear 10/04/2023   GLUCOSEUR Negative 10/04/2023   BILIRUBINUR negative 10/04/2023   KETONESU negative  10/04/2023   SPECGRAV 1.020 10/04/2023   RBCUR negative 10/04/2023   PHUR 6.5 10/04/2023   PROTEINUR Negative 10/04/2023   UROBILINOGEN 0.2 10/04/2023   LEUKOCYTESUR Negative 10/04/2023    Wt Readings from Last 3 Encounters:  10/04/23 147 lb 4 oz (66.8 kg)  06/27/23 144 lb (65.3 kg)  12/12/22 144 lb (  65.3 kg)    BP 124/78   Pulse 84   Ht 5\' 2"  (1.575 m)   Wt 147 lb 4 oz (66.8 kg)   SpO2 95%   BMI 26.93 kg/m   Assessment and Plan:  Problem List Items Addressed This Visit       Unprioritized   Gastroesophageal reflux disease (Chronic)   Reflux symptoms are minimal on current therapy - omeprazole. No red flag signs such as weight loss, n/v, melena       Relevant Medications   omeprazole (PRILOSEC) 20 MG capsule   Tremor (Chronic)   On gabapentin 900 mg nightly with some improvement      Relevant Medications   atenolol (TENORMIN) 25 MG tablet   Primary insomnia   Refill nortriptyline      Relevant Medications   nortriptyline (PAMELOR) 25 MG capsule   Episode of moderate major depression (HCC)   Doing well currently despite chronic back pain, RSL and insomnia Continue Nortriptyline at bedtime. No longer taking Cymbalta      Relevant Medications   nortriptyline (PAMELOR) 25 MG capsule   Other Visit Diagnoses       Subacute vaginitis    -  Primary   obtained Aptima vag swab refill Clobetasol  follow up with new GYN as planned   Relevant Medications   clobetasol cream (TEMOVATE) 0.05 %   Other Relevant Orders   Cervicovaginal ancillary only     Dysuria       UA negative - sx likely due to external irritation   Relevant Orders   POCT urinalysis dipstick (Completed)       No follow-ups on file.    Reubin Milan, MD [facility] [place]

## 2023-10-04 NOTE — Assessment & Plan Note (Signed)
 Doing well currently despite chronic back pain, RSL and insomnia Continue Nortriptyline at bedtime. No longer taking Cymbalta

## 2023-10-08 LAB — CERVICOVAGINAL ANCILLARY ONLY
Bacterial Vaginitis (gardnerella): NEGATIVE
Candida Glabrata: NEGATIVE
Candida Vaginitis: NEGATIVE
Chlamydia: NEGATIVE
Comment: NEGATIVE
Comment: NEGATIVE
Comment: NEGATIVE
Comment: NEGATIVE
Comment: NEGATIVE
Comment: NORMAL
Neisseria Gonorrhea: NEGATIVE
Trichomonas: NEGATIVE

## 2023-10-09 ENCOUNTER — Encounter: Payer: Self-pay | Admitting: Internal Medicine

## 2023-11-21 ENCOUNTER — Ambulatory Visit
Admission: RE | Admit: 2023-11-21 | Discharge: 2023-11-21 | Disposition: A | Discharge status: Home / Self Care | Source: Ambulatory Visit | Attending: Internal Medicine | Admitting: Internal Medicine

## 2023-11-21 DIAGNOSIS — Z780 Asymptomatic menopausal state: Secondary | ICD-10-CM | POA: Insufficient documentation

## 2023-11-22 ENCOUNTER — Encounter: Payer: Self-pay | Admitting: Internal Medicine

## 2023-12-12 ENCOUNTER — Ambulatory Visit: Payer: Self-pay | Admitting: Internal Medicine

## 2023-12-13 ENCOUNTER — Encounter: Payer: Self-pay | Admitting: Internal Medicine

## 2023-12-13 ENCOUNTER — Ambulatory Visit (INDEPENDENT_AMBULATORY_CARE_PROVIDER_SITE_OTHER): Admitting: Internal Medicine

## 2023-12-13 VITALS — BP 128/72 | HR 62 | Ht 62.0 in | Wt 147.0 lb

## 2023-12-13 DIAGNOSIS — R7989 Other specified abnormal findings of blood chemistry: Secondary | ICD-10-CM | POA: Diagnosis not present

## 2023-12-13 DIAGNOSIS — I10 Essential (primary) hypertension: Secondary | ICD-10-CM

## 2023-12-13 DIAGNOSIS — R7303 Prediabetes: Secondary | ICD-10-CM | POA: Diagnosis not present

## 2023-12-13 DIAGNOSIS — Z1231 Encounter for screening mammogram for malignant neoplasm of breast: Secondary | ICD-10-CM

## 2023-12-13 NOTE — Progress Notes (Signed)
 Date:  12/13/2023   Name:  Roberta Ward   DOB:  [birth year]   MRN:  [id number]   Chief Complaint: Hypertension   Hypertension This is a chronic problem. The problem is controlled. Pertinent negatives include no chest pain, headaches, palpitations or shortness of breath. Past treatments include beta blockers.  Diabetes She presents for her follow-up diabetic visit. Diabetes type: prediabetes. Her disease course has been stable. Pertinent negatives for hypoglycemia include no dizziness, headaches or nervousness/anxiousness. Associated symptoms include fatigue. Pertinent negatives for diabetes include no chest pain and no weakness.  Elevated liver function tests - mild elevation,persistent with normal serologies.  US  showed mild fatty liver.  Recommended low fat diet and monitoring. Fatigue - generalized fatigue, not feeling well, no energy.  No specific symptoms.  She is still dealing with back pain and likely needs surgery.  Sleeping well and off of a number of medications - no more gabapentin, trazodone.  Review of Systems  Constitutional:  Positive for fatigue. Negative for chills, diaphoresis and unexpected weight change.  HENT:  Negative for trouble swallowing.   Eyes:  Negative for visual disturbance.  Respiratory:  Negative for cough, chest tightness, shortness of breath and wheezing.   Cardiovascular:  Negative for chest pain, palpitations and leg swelling.  Gastrointestinal:  Negative for abdominal pain, constipation and diarrhea.  Genitourinary:  Negative for frequency and urgency.  Musculoskeletal:  Negative for arthralgias and myalgias.  Neurological:  Negative for dizziness, weakness, light-headedness and headaches.  Psychiatric/Behavioral:  Negative for dysphoric mood and sleep disturbance. The patient is not nervous/anxious.      Lab Results  Component Value Date   NA 140 06/27/2023   K 4.6 06/27/2023   CO2 18 (L) 06/27/2023   GLUCOSE 109 (H) 06/27/2023   BUN 17  06/27/2023   CREATININE 0.60 06/27/2023   CALCIUM 9.5 06/27/2023   EGFR 97 06/27/2023   GFRNONAA >60 12/18/2022   Lab Results  Component Value Date   CHOL 225 (H) 06/27/2023   HDL 48 06/27/2023   LDLCALC 148 (H) 06/27/2023   TRIG 161 (H) 06/27/2023   CHOLHDL 4.7 (H) 06/27/2023   Lab Results  Component Value Date   TSH 2.050 06/27/2023   Lab Results  Component Value Date   HGBA1C 6.1 (H) 06/27/2023   Lab Results  Component Value Date   WBC 6.6 06/27/2023   HGB 14.3 06/27/2023   HCT 43.5 06/27/2023   MCV 98 (H) 06/27/2023   PLT 269 06/27/2023   Lab Results  Component Value Date   ALT 81 (H) 06/27/2023   AST 74 (H) 06/27/2023   ALKPHOS 63 06/27/2023   BILITOT 0.5 06/27/2023   Lab Results  Component Value Date   VD25OH 31.9 06/27/2023     Patient Active Problem List   Diagnosis Date Noted   Elevated liver function tests 06/28/2023   Restless legs syndrome (RLS) 07/12/2022   Cervical spondylosis 02/26/2022   Lumbar radiculopathy 02/05/2022   Essential hypertension 01/10/2022   Derangement of right shoulder joint 10/30/2021   Tremor 02/03/2021   Episode of moderate major depression (HCC) 02/03/2021   Polyarthralgia 02/03/2021   Gastroesophageal reflux disease 09/20/2020   Mild hyperlipidemia 07/06/2020   Prediabetes 04/01/2020   Closed compression fracture of L2 lumbar vertebra, initial encounter (HCC) 03/02/2020   Age-related osteoporosis with current pathological fracture 03/01/2020   Primary insomnia 03/01/2020   Primary osteoarthritis of left knee 03/01/2020   Lumbar back pain 03/01/2020   Patient is Jehovah's Witness  03/01/2020   Colon polyp 08/14/2016   Vitamin D  deficiency 01/25/2016    Allergies  Allergen Reactions   Sulfa Antibiotics Hives and Itching   Latex    Talc     Other reaction(s): Angioedema    Past Surgical History:  Procedure Laterality Date   ABDOMINAL HYSTERECTOMY     KNEE CARTILAGE SURGERY Left    PARTIAL HYSTERECTOMY   1980   Rt ovary still present and cervix still present.   TONSILECTOMY/ADENOIDECTOMY WITH MYRINGOTOMY      Social History   Tobacco Use   Smoking status: Never   Smokeless tobacco: Never  Vaping Use   Vaping status: Never Used  Substance Use Topics   Alcohol use: Yes    Comment: rare- occasional   Drug use: Never     Medication list has been reviewed and updated.  Current Meds  Medication Sig   Ascorbic Acid (VITAMIN C) 1000 MG tablet Take 1,000 mg by mouth daily.   atenolol  (TENORMIN ) 25 MG tablet Take 1 tablet (25 mg total) by mouth daily.   Cholecalciferol (VITAMIN D ) 50 MCG (2000 UT) CAPS Take by mouth.   clobetasol  cream (TEMOVATE ) 0.05 % Apply 1 Application topically 2 (two) times daily.   denosumab (PROLIA) 60 MG/ML SOSY injection Inject into the skin. q6 months   estradiol (ESTRACE) 0.1 MG/GM vaginal cream Place 1 Applicatorful vaginally daily.   omeprazole  (PRILOSEC) 20 MG capsule Take 1 capsule (20 mg total) by mouth daily.       12/13/2023   10:06 AM 10/04/2023    1:44 PM 06/27/2023    8:00 AM 12/12/2022    8:31 AM  GAD 7 : Generalized Anxiety Score  Nervous, Anxious, on Edge 0 0 0 0  Control/stop worrying 0 0 0 0  Worry too much - different things 0 0 0 0  Trouble relaxing 0 0 2 0  Restless 0 0 0 0  Easily annoyed or irritable 0 0 0 0  Afraid - awful might happen 0 0 1 0  Total GAD 7 Score 0 0 3 0  Anxiety Difficulty Not difficult at all Not difficult at all Not difficult at all Not difficult at all       12/13/2023   10:06 AM 10/04/2023    1:44 PM 06/27/2023    8:00 AM  Depression screen PHQ 2/9  Decreased Interest 0 0 0  Down, Depressed, Hopeless 0 0 0  PHQ - 2 Score 0 0 0  Altered sleeping 3 3 3   Tired, decreased energy 0 0 0  Change in appetite 0 0 0  Feeling bad or failure about yourself  0 0 0  Trouble concentrating 0 0 0  Moving slowly or fidgety/restless 0 0 0  Suicidal thoughts 0 0 0  PHQ-9 Score 3 3 3   Difficult doing work/chores Not  difficult at all Not difficult at all Not difficult at all    BP Readings from Last 3 Encounters:  12/13/23 128/72  10/04/23 124/78  06/27/23 128/78    Physical Exam Vitals and nursing note reviewed.  Constitutional:      General: She is not in acute distress.    Appearance: She is well-developed.  HENT:     Head: Normocephalic and atraumatic.  Neck:     Vascular: No carotid bruit.  Cardiovascular:     Rate and Rhythm: Normal rate and regular rhythm.     Heart sounds: No murmur heard. Pulmonary:     Effort: Pulmonary effort  is normal. No respiratory distress.     Breath sounds: No wheezing or rhonchi.  Musculoskeletal:     Cervical back: Normal range of motion.     Right lower leg: No edema.     Left lower leg: No edema.  Lymphadenopathy:     Cervical: No cervical adenopathy.  Skin:    General: Skin is warm and dry.     Findings: No rash.  Neurological:     General: No focal deficit present.     Mental Status: She is alert and oriented to person, place, and time.  Psychiatric:        Mood and Affect: Mood normal.        Behavior: Behavior normal.     Wt Readings from Last 3 Encounters:  12/13/23 147 lb (66.7 kg)  10/04/23 147 lb 4 oz (66.8 kg)  06/27/23 144 lb (65.3 kg)    BP 128/72   Pulse 62   Ht 5\' 2"  (1.575 m)   Wt 147 lb (66.7 kg)   SpO2 95%   BMI 26.89 kg/m   Assessment and Plan:  Problem List Items Addressed This Visit       Unprioritized   Prediabetes (Chronic)   Managed with diet only. Lab Results  Component Value Date   HGBA1C 6.1 (H) 06/27/2023         Relevant Orders   Comprehensive metabolic panel with GFR   Hemoglobin A1c   Essential hypertension (Chronic)   Blood pressure is well controlled on metoprolol.  However, having fatigue and vague feeling of being unwell. Current medications metoprolol - will hold for three weeks to determine possible contribution. She will call if improved for alternative medications for HTN        Relevant Orders   Comprehensive metabolic panel with GFR   CBC with Differential/Platelet   TSH   Elevated liver function tests - Primary   Mild elevation was persistent last December. US  - mild fatty liver Will repeat labs; continue low fat diet.      Relevant Orders   Comprehensive metabolic panel with GFR   Iron, TIBC and Ferritin Panel   Other Visit Diagnoses       Encounter for screening mammogram for breast cancer       patient due for mammogram this month. already ordered - she will schedule       No follow-ups on file.    Sheron Dixons, MD [facility] [place]

## 2023-12-13 NOTE — Patient Instructions (Addendum)
 Call [facility] to schedule your mammogram at [phone].  Put the atenolol  on hold for up to 3 weeks to determine if that is the cause of your symptoms.

## 2023-12-13 NOTE — Assessment & Plan Note (Signed)
 Mild elevation was persistent last December. US  - mild fatty liver Will repeat labs; continue low fat diet.

## 2023-12-13 NOTE — Assessment & Plan Note (Addendum)
 Blood pressure is well controlled on metoprolol.  However, having fatigue and vague feeling of being unwell. Current medications metoprolol - will hold for three weeks to determine possible contribution. She will call if improved for alternative medications for HTN

## 2023-12-13 NOTE — Assessment & Plan Note (Signed)
 Managed with diet only. Lab Results  Component Value Date   HGBA1C 6.1 (H) 06/27/2023

## 2023-12-17 ENCOUNTER — Ambulatory Visit: Payer: Self-pay | Admitting: Internal Medicine

## 2023-12-17 LAB — CBC WITH DIFFERENTIAL/PLATELET
Basophils Absolute: 0 10*3/uL (ref 0.0–0.2)
Basos: 0 %
EOS (ABSOLUTE): 0.4 10*3/uL (ref 0.0–0.4)
Eos: 5 %
Hematocrit: 46.1 % (ref 34.0–46.6)
Hemoglobin: 15.2 g/dL (ref 11.1–15.9)
Immature Grans (Abs): 0 10*3/uL (ref 0.0–0.1)
Immature Granulocytes: 0 %
Lymphocytes Absolute: 1.7 10*3/uL (ref 0.7–3.1)
Lymphs: 22 %
MCH: 32.9 pg (ref 26.6–33.0)
MCHC: 33 g/dL (ref 31.5–35.7)
MCV: 100 fL — ABNORMAL HIGH (ref 79–97)
Monocytes Absolute: 0.5 10*3/uL (ref 0.1–0.9)
Monocytes: 6 %
Neutrophils Absolute: 5.2 10*3/uL (ref 1.4–7.0)
Neutrophils: 67 %
Platelets: 264 10*3/uL (ref 150–450)
RBC: 4.62 x10E6/uL (ref 3.77–5.28)
RDW: 12.8 % (ref 11.7–15.4)
WBC: 7.7 10*3/uL (ref 3.4–10.8)

## 2023-12-17 LAB — COMPREHENSIVE METABOLIC PANEL WITH GFR
ALT: 38 IU/L — ABNORMAL HIGH (ref 0–32)
AST: 30 IU/L (ref 0–40)
Albumin: 4.7 g/dL (ref 3.9–4.9)
Alkaline Phosphatase: 57 IU/L (ref 44–121)
BUN/Creatinine Ratio: 23 (ref 12–28)
BUN: 19 mg/dL (ref 8–27)
Bilirubin Total: 0.3 mg/dL (ref 0.0–1.2)
CO2: 21 mmol/L (ref 20–29)
Calcium: 10.1 mg/dL (ref 8.7–10.3)
Chloride: 104 mmol/L (ref 96–106)
Creatinine, Ser: 0.83 mg/dL (ref 0.57–1.00)
Globulin, Total: 2.4 g/dL (ref 1.5–4.5)
Glucose: 94 mg/dL (ref 70–99)
Potassium: 4.5 mmol/L (ref 3.5–5.2)
Sodium: 141 mmol/L (ref 134–144)
Total Protein: 7.1 g/dL (ref 6.0–8.5)
eGFR: 76 mL/min/{1.73_m2} (ref >59)

## 2023-12-17 LAB — IRON,TIBC AND FERRITIN PANEL
Ferritin: 172 ng/mL — ABNORMAL HIGH (ref 15–150)
Iron Saturation: 25 % (ref 15–55)
Iron: 101 ug/dL (ref 27–139)
Total Iron Binding Capacity: 412 ug/dL (ref 250–450)
UIBC: 311 ug/dL (ref 118–369)

## 2023-12-17 LAB — HEMOGLOBIN A1C
Est. average glucose Bld gHb Est-mCnc: 126 mg/dL
Hgb A1c MFr Bld: 6 % — ABNORMAL HIGH (ref 4.8–5.6)

## 2023-12-17 LAB — TSH: TSH: 1.53 u[IU]/mL (ref 0.450–4.500)

## 2023-12-30 ENCOUNTER — Other Ambulatory Visit: Payer: Self-pay | Admitting: Internal Medicine

## 2023-12-30 DIAGNOSIS — F5101 Primary insomnia: Secondary | ICD-10-CM

## 2023-12-31 NOTE — Telephone Encounter (Signed)
 Requested Prescriptions  Refused Prescriptions Disp Refills   nortriptyline  (PAMELOR ) 25 MG capsule [Pharmacy Med Name: NORTRIPTYLINE  HCL 25 MG CAP] 90 capsule 0    Sig: TAKE 1 CAPSULE BY MOUTH AT BEDTIME.     Psychiatry:  Antidepressants - Heterocyclics (TCAs) Passed - 12/31/2023  9:45 AM      Passed - Completed PHQ-2 or PHQ-9 in the last 360 days      Passed - Valid encounter within last 6 months    Recent Outpatient Visits           2 weeks ago Elevated liver function tests   [facility] at [facility], Chales [place], MD   2 months ago Subacute vaginitis   [facility] at [facility], Chales [place], MD       Future Appointments             In 6 months Gala Jubilee, Chales [place], MD [facility] at [facility], [facility]

## 2024-01-16 ENCOUNTER — Other Ambulatory Visit: Payer: Self-pay | Admitting: Internal Medicine

## 2024-01-16 DIAGNOSIS — Z1231 Encounter for screening mammogram for malignant neoplasm of breast: Secondary | ICD-10-CM

## 2024-01-30 ENCOUNTER — Ambulatory Visit
Admission: RE | Admit: 2024-01-30 | Discharge: 2024-01-30 | Disposition: A | Discharge status: Home / Self Care | Source: Ambulatory Visit | Attending: Internal Medicine | Admitting: Internal Medicine

## 2024-01-30 DIAGNOSIS — Z1231 Encounter for screening mammogram for malignant neoplasm of breast: Secondary | ICD-10-CM | POA: Diagnosis present

## 2024-03-24 ENCOUNTER — Encounter: Payer: Self-pay | Admitting: Internal Medicine

## 2024-03-24 ENCOUNTER — Ambulatory Visit: Payer: Self-pay

## 2024-03-24 ENCOUNTER — Ambulatory Visit: Admitting: Internal Medicine

## 2024-03-24 ENCOUNTER — Telehealth: Payer: Self-pay | Admitting: Internal Medicine

## 2024-03-24 NOTE — Telephone Encounter (Signed)
 FYI Only or Action Required?: FYI only for provider.  Patient was last seen in primary care on 12/13/2023 by Justus Leita DEL, MD.  Called Nurse Triage reporting Generalized Body Aches.  Symptoms began several weeks ago.  Interventions attempted: OTC medications: tylenol and ibuprofen.  Symptoms are: unchanged.  Triage Disposition: See PCP When Office is Open (Within 3 Days)  Patient/caregiver understands and will follow disposition?: Yes, will follow disposition  Copied from CRM [id number]. Topic: Clinical - Red Word Triage >> Mar 24, 2024  9:04 AM Gennette ORN wrote: Red Word that prompted transfer to Nurse Triage: Patient is calling because she has a lot of body pain , she said hasn't had any energy to move or anything. Reason for Disposition  [1] MODERATE pain (e.g., interferes with normal activities) AND [2] present > 3 days  Answer Assessment - Initial Assessment Questions 1. ONSET: When did the muscle aches or body pains start?      Few months 2. LOCATION: What part of your body is hurting? (e.g., entire body, arms, legs)      Joints, hx of arthritis. Aching, not sure if bones or muscles 3. SEVERITY: How bad is the pain? (Scale 1-10; or mild, moderate, severe)     5 4. CAUSE: What do you think is causing the pains?     unsure 5. FEVER: Do you have a fever? If Yes, ask: What is your temperature, how was it measured, and  when did it start?      denies 6. OTHER SYMPTOMS: Do you have any other symptoms? (e.g., chest pain, cold or flu symptoms, rash, weakness, weight loss)     denies 8. TRAVEL: Have you traveled out of the country in the last month? (e.g., exposures, travel history)     denies  Pt states that she has been told she is prediabetic and is concerned that she is now diabetic.  Protocols used: Muscle Aches and Body Pain-A-AH

## 2024-03-24 NOTE — Telephone Encounter (Signed)
 Noted  Pt has a appt.  KP

## 2024-03-24 NOTE — Telephone Encounter (Unsigned)
 Copied from CRM [id number]. Topic: Appointments - Transfer of Care >> Mar 24, 2024  9:02 AM Gennette ORN wrote: Pt is requesting to transfer FROM: Dr. Justus Pt is requesting to transfer TO: Dr. Lemon  Reason for requested transfer: because she is leaving  It is the responsibility of the team the patient would like to transfer to (Dr. Justus) to reach out to the patient if for any reason this transfer is not acceptable.

## 2024-03-24 NOTE — Telephone Encounter (Signed)
 Noted  KP

## 2024-03-25 ENCOUNTER — Ambulatory Visit: Admitting: Internal Medicine

## 2024-04-30 ENCOUNTER — Ambulatory Visit (INDEPENDENT_AMBULATORY_CARE_PROVIDER_SITE_OTHER): Admitting: Student

## 2024-04-30 ENCOUNTER — Encounter: Payer: Self-pay | Admitting: Student

## 2024-04-30 VITALS — BP 118/70 | HR 64 | Ht 62.0 in | Wt 140.5 lb

## 2024-04-30 DIAGNOSIS — R7303 Prediabetes: Secondary | ICD-10-CM

## 2024-04-30 DIAGNOSIS — K219 Gastro-esophageal reflux disease without esophagitis: Secondary | ICD-10-CM | POA: Diagnosis not present

## 2024-04-30 DIAGNOSIS — I10 Essential (primary) hypertension: Secondary | ICD-10-CM

## 2024-04-30 DIAGNOSIS — M8000XD Age-related osteoporosis with current pathological fracture, unspecified site, subsequent encounter for fracture with routine healing: Secondary | ICD-10-CM

## 2024-04-30 DIAGNOSIS — G2581 Restless legs syndrome: Secondary | ICD-10-CM

## 2024-04-30 DIAGNOSIS — M545 Low back pain, unspecified: Secondary | ICD-10-CM

## 2024-04-30 DIAGNOSIS — E559 Vitamin D deficiency, unspecified: Secondary | ICD-10-CM

## 2024-04-30 DIAGNOSIS — F5101 Primary insomnia: Secondary | ICD-10-CM

## 2024-04-30 DIAGNOSIS — E785 Hyperlipidemia, unspecified: Secondary | ICD-10-CM

## 2024-04-30 NOTE — Progress Notes (Signed)
 Established Patient Office Visit  Subjective   Patient ID: Roberta Ward, female    DOB: 1956-04-30  Age: 68 y.o. MRN: [id number]  Chief Complaint  Patient presents with   Establish Care    Roberta Ward with is a 68 year old person living with hx listed below presents today for transfer of care. Previously seeing my colleague Dr. Justus who is retiring soon.  She recently started going to chiropractor for chronic lower back pain about 1 month ago. Imaging with Progressive lower lumbar facet arthropathy with grade 1 anterolisthesis at L4-5 and L5-S1. Has tried multiple medications that have not been helpful. Not currently taking medications. No changes pain at this time.   Patient Active Problem List   Diagnosis Date Noted   Elevated liver function tests 06/28/2023   Restless legs syndrome (RLS) 07/12/2022   Cervical spondylosis 02/26/2022   Essential hypertension 01/10/2022   Derangement of right shoulder joint 10/30/2021   Tremor 02/03/2021   Episode of moderate major depression (HCC) 02/03/2021   Polyarthralgia 02/03/2021   Gastroesophageal reflux disease 09/20/2020   Mild hyperlipidemia 07/06/2020   Prediabetes 04/01/2020   Closed compression fracture of L2 lumbar vertebra, initial encounter (HCC) 03/02/2020   Age-related osteoporosis with current pathological fracture 03/01/2020   Primary insomnia 03/01/2020   Primary osteoarthritis of left knee 03/01/2020   Lumbar back pain 03/01/2020   Patient is Jehovah's Witness 03/01/2020   Colon polyp 08/14/2016   Vitamin D  deficiency 01/25/2016      ROS Refer to HPI    Objective:     Outpatient Encounter Medications as of 04/30/2024  Medication Sig   Ascorbic Acid (VITAMIN C) 1000 MG tablet Take 1,000 mg by mouth daily.   clobetasol  cream (TEMOVATE ) 0.05 % Apply 1 Application topically 2 (two) times daily.   denosumab (PROLIA) 60 MG/ML SOSY injection Inject into the skin. q6 months   estradiol (ESTRACE) 0.1 MG/GM  vaginal cream Place 1 Applicatorful vaginally daily.   omeprazole  (PRILOSEC) 20 MG capsule Take 1 capsule (20 mg total) by mouth daily. (Patient taking differently: Take 20 mg by mouth as needed.)   [DISCONTINUED] Cholecalciferol (VITAMIN D ) 50 MCG (2000 UT) CAPS Take by mouth.   [DISCONTINUED] atenolol  (TENORMIN ) 25 MG tablet Take 1 tablet (25 mg total) by mouth daily. (Patient not taking: Reported on 04/30/2024)   No facility-administered encounter medications on file as of 04/30/2024.    BP 118/70   Pulse 64   Ht 5' 2 (1.575 m)   Wt 140 lb 8 oz (63.7 kg)   SpO2 97%   BMI 25.70 kg/m  BP Readings from Last 3 Encounters:  04/30/24 118/70  12/13/23 128/72  10/04/23 124/78    Physical Exam Constitutional:      Appearance: Normal appearance.  HENT:     Mouth/Throat:     Mouth: Mucous membranes are moist.     Pharynx: Oropharynx is clear.  Cardiovascular:     Rate and Rhythm: Normal rate and regular rhythm.  Pulmonary:     Effort: Pulmonary effort is normal.     Breath sounds: No rhonchi or rales.  Abdominal:     General: Abdomen is flat. Bowel sounds are normal. There is no distension.     Palpations: Abdomen is soft.     Tenderness: There is no abdominal tenderness.  Musculoskeletal:        General: Normal range of motion.     Right lower leg: No edema.     Left lower leg:  No edema.  Skin:    General: Skin is warm and dry.     Capillary Refill: Capillary refill takes less than 2 seconds.  Neurological:     General: No focal deficit present.     Mental Status: She is alert and oriented to person, place, and time.  Psychiatric:        Mood and Affect: Mood normal.        Behavior: Behavior normal.        04/30/2024   10:25 AM 12/13/2023   10:06 AM 10/04/2023    1:44 PM  Depression screen PHQ 2/9  Decreased Interest 0 0 0  Down, Depressed, Hopeless 0 0 0  PHQ - 2 Score 0 0 0  Altered sleeping 3 3 3   Tired, decreased energy 2 0 0  Change in appetite 0 0 0   Feeling bad or failure about yourself  0 0 0  Trouble concentrating 0 0 0  Moving slowly or fidgety/restless 0 0 0  Suicidal thoughts 0 0 0  PHQ-9 Score 5 3 3   Difficult doing work/chores Somewhat difficult Not difficult at all Not difficult at all       04/30/2024   10:25 AM 12/13/2023   10:06 AM 10/04/2023    1:44 PM 06/27/2023    8:00 AM  GAD 7 : Generalized Anxiety Score  Nervous, Anxious, on Edge 0 0 0 0  Control/stop worrying 0 0 0 0  Worry too much - different things 0 0 0 0  Trouble relaxing 0 0 0 2  Restless 0 0 0 0  Easily annoyed or irritable 0 0 0 0  Afraid - awful might happen 0 0 0 1  Total GAD 7 Score 0 0 0 3  Anxiety Difficulty Not difficult at all Not difficult at all Not difficult at all Not difficult at all    No results found for any visits on 04/30/24.  Last CBC Lab Results  Component Value Date   WBC 7.7 12/13/2023   HGB 15.2 12/13/2023   HCT 46.1 12/13/2023   MCV 100 (H) 12/13/2023   MCH 32.9 12/13/2023   RDW 12.8 12/13/2023   PLT 264 12/13/2023   Last metabolic panel Lab Results  Component Value Date   GLUCOSE 94 12/13/2023   NA 141 12/13/2023   K 4.5 12/13/2023   CL 104 12/13/2023   CO2 21 12/13/2023   BUN 19 12/13/2023   CREATININE 0.83 12/13/2023   EGFR 76 12/13/2023   CALCIUM 10.1 12/13/2023   PROT 7.1 12/13/2023   ALBUMIN 4.7 12/13/2023   LABGLOB 2.4 12/13/2023   AGRATIO 2.1 07/12/2022   BILITOT 0.3 12/13/2023   ALKPHOS 57 12/13/2023   AST 30 12/13/2023   ALT 38 (H) 12/13/2023   ANIONGAP 14 12/18/2022   Last lipids Lab Results  Component Value Date   CHOL 225 (H) 06/27/2023   HDL 48 06/27/2023   LDLCALC 148 (H) 06/27/2023   TRIG 161 (H) 06/27/2023   CHOLHDL 4.7 (H) 06/27/2023   Last hemoglobin A1c Lab Results  Component Value Date   HGBA1C 6.0 (H) 12/13/2023      The 10-year ASCVD risk score (Arnett DK, et al., 2019) is: 8.7%    Assessment & Plan:  Age-related osteoporosis with current pathological fracture  with routine healing, subsequent encounter Assessment & Plan: Due for prolia in November, will follow up with endocrinology at that time.    Essential hypertension Assessment & Plan: Stopped taking atenolol  25 mg about  4 months ago, and reports BP at home has been typically 120s over 70s. Normotensive today. Will monitor BP while off medication.    Prediabetes Assessment & Plan: Lab Results  Component Value Date   HGBA1C 6.0 (H) 12/13/2023   HGBA1C 6.1 (H) 06/27/2023   HGBA1C 6.0 (H) 07/12/2022   Diet controlled, repeat at next visit.    Gastroesophageal reflux disease, unspecified whether esophagitis present Assessment & Plan: Taking omeprazole  as needed.    Primary insomnia Assessment & Plan: No longer taking nortriptyline , is taking magnesium supplements and feels like this is helping with this. Continue to monitor.    Vitamin D  deficiency Assessment & Plan: Vitamin D  31.9 in 06/2023. Recommended continue vitamin D  supplement.    Mild hyperlipidemia Assessment & Plan: The 10-year ASCVD risk score (Arnett DK, et al., 2019) is: 8.7%. Intermediate risk. Will repeat lipid panel at next visit.     Lumbar back pain Assessment & Plan: Started after a fall from a step ladder 10/2018, has been on multiple classes of medications. Current seeing chiropractor.       No follow-ups on file.    Harlene Saddler, MD

## 2024-04-30 NOTE — Assessment & Plan Note (Addendum)
 Lab Results  Component Value Date   HGBA1C 6.0 (H) 12/13/2023   HGBA1C 6.1 (H) 06/27/2023   HGBA1C 6.0 (H) 07/12/2022   Diet controlled, repeat at next visit.

## 2024-04-30 NOTE — Assessment & Plan Note (Signed)
 Taking omeprazole as needed

## 2024-04-30 NOTE — Assessment & Plan Note (Addendum)
 Due for prolia in November, will follow up with endocrinology at that time.

## 2024-04-30 NOTE — Assessment & Plan Note (Addendum)
 No longer taking nortriptyline , is taking magnesium supplements and feels like this is helping with this. Continue to monitor.

## 2024-04-30 NOTE — Assessment & Plan Note (Addendum)
 Stopped taking atenolol  25 mg about 4 months ago, and reports BP at home has been typically 120s over 70s. Normotensive today. Will monitor BP while off medication.

## 2024-05-07 NOTE — Assessment & Plan Note (Signed)
 Started after a fall from a step ladder 10/2018, has been on multiple classes of medications. Current seeing chiropractor.

## 2024-05-07 NOTE — Assessment & Plan Note (Signed)
 The 10-year ASCVD risk score (Arnett DK, et al., 2019) is: 8.7%. Intermediate risk. Will repeat lipid panel at next visit.

## 2024-05-07 NOTE — Assessment & Plan Note (Signed)
 Vitamin D  31.9 in 06/2023. Recommended continue vitamin D  supplement.

## 2024-06-03 ENCOUNTER — Ambulatory Visit: Payer: Self-pay

## 2024-06-29 ENCOUNTER — Encounter: Payer: Medicare Other | Admitting: Internal Medicine

## 2024-07-02 ENCOUNTER — Encounter: Payer: Self-pay | Admitting: Internal Medicine

## 2024-07-02 ENCOUNTER — Encounter: Admitting: Student

## 2024-07-07 ENCOUNTER — Encounter: Payer: Self-pay | Admitting: Internal Medicine

## 2024-07-07 ENCOUNTER — Ambulatory Visit: Admitting: Internal Medicine

## 2024-07-07 VITALS — BP 132/67 | HR 71 | Temp 97.8°F | Resp 16 | Ht 61.0 in | Wt 143.0 lb

## 2024-07-07 DIAGNOSIS — R7309 Other abnormal glucose: Secondary | ICD-10-CM

## 2024-07-07 DIAGNOSIS — M810 Age-related osteoporosis without current pathological fracture: Secondary | ICD-10-CM

## 2024-07-07 DIAGNOSIS — M2559 Pain in other specified joint: Secondary | ICD-10-CM

## 2024-07-07 MED ORDER — FLUCONAZOLE 150 MG PO TABS: 150.0000 mg | ORAL_TABLET | Freq: Every day | ORAL | 0 refills | Status: AC

## 2024-07-07 NOTE — Progress Notes (Unsigned)
 [facility] [address] [place], [place] [address]  Internal MEDICINE  Office Visit Note  Patient Name: Roberta Ward  [id number]  [id number]  Date of Service: 07/08/2024   Complaints/HPI Pt is here for establishment of PCP. Chief Complaint  Patient presents with   New Patient (Initial Visit)    Arthritis and pre diabetic    HPI Pt is seen for establishment of PCP She is having joint pain off and on, has osteoporosis, amd takes Prolia injection, she is not so regular for Vit D and calcium intake  Has ongoing vaginal itching. Has been using HRT and topical steroids  Gives h/o elevated hg A1c ( prediabetes)  Current Medication: Outpatient Encounter Medications as of 07/07/2024  Medication Sig   Ascorbic Acid (VITAMIN C) 1000 MG tablet Take 1,000 mg by mouth daily.   denosumab (PROLIA) 60 MG/ML SOSY injection Inject into the skin. q6 months   estradiol (ESTRACE) 0.1 MG/GM vaginal cream Place 1 Applicatorful vaginally daily.   fluconazole  (DIFLUCAN ) 150 MG tablet Take 1 tablet (150 mg total) by mouth daily.   clobetasol  cream (TEMOVATE ) 0.05 % Apply 1 Application topically 2 (two) times daily. (Patient not taking: Reported on 07/07/2024)   omeprazole  (PRILOSEC) 20 MG capsule Take 1 capsule (20 mg total) by mouth daily. (Patient not taking: Reported on 07/07/2024)   No facility-administered encounter medications on file as of 07/07/2024.    Surgical History: Past Surgical History:  Procedure Laterality Date   ABDOMINAL HYSTERECTOMY     KNEE CARTILAGE SURGERY Left    PARTIAL HYSTERECTOMY  1980   Rt ovary still present and cervix still present.   TONSILECTOMY/ADENOIDECTOMY WITH MYRINGOTOMY      Medical History: Past Medical History:  Diagnosis Date   Arthritis     Family History: Family History  Problem Relation Age of Onset   Breast cancer Mother    Breast cancer Maternal Aunt     Social History   Socioeconomic History   Marital status:  Married    Spouse name: Orlando    Number of children: 1   Years of education: Not on file   Highest education level: Not on file  Occupational History   Not on file  Tobacco Use   Smoking status: Never   Smokeless tobacco: Never  Vaping Use   Vaping status: Never Used  Substance and Sexual Activity   Alcohol use: Yes    Comment: rare- occasional   Drug use: Never   Sexual activity: Not Currently  Other Topics Concern   Not on file  Social History Narrative   Not on file   Social Drivers of Health   Tobacco Use: Low Risk (07/07/2024)   Patient History    Smoking Tobacco Use: Never    Smokeless Tobacco Use: Never    Passive Exposure: Not on file  Financial Resource Strain: Low Risk  (03/04/2024)   [facility]   Overall Financial Resource Strain (CARDIA)    Difficulty of Paying Living Expenses: Not hard at all  Food Insecurity: No Food Insecurity (06/04/2024)   Received from [facility]   Epic    Within the past 12 months, you worried that your food would run out before you got the money to buy more.: Never true    Within the past 12 months, the food you bought just didn't last and you didn't have money to get more.: Never true  Transportation Needs: No Transportation Needs (06/04/2024)  Received from [facility] - Transportation    In the past 12 months, has lack of transportation kept you from medical appointments or from getting medications?: No    Lack of Transportation (Non-Medical): No  Physical Activity: Insufficiently Active (05/22/2023)   Exercise Vital Sign    Days of Exercise per Week: 3 days    Minutes of Exercise per Session: 30 min  Stress: No Stress Concern Present (05/22/2023)   [employer] of Occupational [employer] - Occupational Stress Questionnaire    Feeling of Stress : Not at all  Social Connections: Moderately Integrated (05/22/2023)   Social Connection and Isolation  Panel    Frequency of Communication with Friends and Family: More than three times a week    Frequency of Social Gatherings with Friends and Family: Three times a week    Attends Religious Services: More than 4 times per year    Active Member of Clubs or Organizations: No    Attends Banker Meetings: Never    Marital Status: Married  Catering Manager Violence: Not At Risk (05/22/2023)   Humiliation, Afraid, Rape, and Kick questionnaire    Fear of Current or Ex-Partner: No    Emotionally Abused: No    Physically Abused: No    Sexually Abused: No  Depression (PHQ2-9): Low Risk (07/07/2024)   Depression (PHQ2-9)    PHQ-2 Score: 0  Recent Concern: Depression (PHQ2-9) - Medium Risk (04/30/2024)   Depression (PHQ2-9)    PHQ-2 Score: 5  Alcohol Screen: Low Risk (07/07/2024)   Alcohol Screen    Last Alcohol Screening Score (AUDIT): 1  Housing: Low Risk  (03/04/2024)   Received from [facility]   Epic    In the last 12 months, was there a time when you were not able to pay the mortgage or rent on time?: No    In the past 12 months, how many times have you moved where you were living?: 0    At any time in the past 12 months, were you homeless or living in a shelter (including now)?: No  Utilities: Not At Risk (03/04/2024)   Received from [facility]   Epic    In the past 12 months has the electric, gas, oil, or water company threatened to shut off services in your home?: No  Health Literacy: Adequate Health Literacy (05/22/2023)   B1300 Health Literacy    Frequency of need for help with medical instructions: Never     Review of Systems  Constitutional:  Negative for fatigue and fever.  HENT:  Negative for congestion, mouth sores and postnasal drip.   Respiratory:  Negative for cough.   Cardiovascular:  Negative for chest pain.  Genitourinary:  Positive for vaginal discharge. Negative for flank pain.  Musculoskeletal:  Positive for  arthralgias and myalgias.  Psychiatric/Behavioral: Negative.      Vital Signs: BP 132/67   Pulse 71   Temp 97.8 F (36.6 C)   Resp 16   Ht 5' 1 (1.549 m)   Wt 143 lb (64.9 kg)   SpO2 97%   BMI 27.02 kg/m    Physical Exam Constitutional:      Appearance: Normal appearance.  HENT:     Head: Normocephalic and atraumatic.     Nose: Nose normal.     Mouth/Throat:     Mouth: Mucous membranes are moist.     Pharynx: No posterior oropharyngeal erythema.  Eyes:  Extraocular Movements: Extraocular movements intact.     Pupils: Pupils are equal, round, and reactive to light.  Cardiovascular:     Pulses: Normal pulses.     Heart sounds: Normal heart sounds.  Pulmonary:     Effort: Pulmonary effort is normal.     Breath sounds: Normal breath sounds.  Musculoskeletal:        General: Swelling and deformity present.  Neurological:     General: No focal deficit present.     Mental Status: She is alert.  Psychiatric:        Mood and Affect: Mood normal.        Behavior: Behavior normal.       Assessment/Plan: 1. Pain in other joint (Primary) Might need treatment for OA/fibromyalgia or MCD, will reevaluate after diagnostics   - ANA Direct w/Reflex if Positive - CYCLIC CITRUL PEPTIDE ANTIBODY, IGG/IGA - Sed Rate (ESR)  2. Senile osteoporosis Add calcium and Vit D, Continue prolia injections   3. Abnormal glucose Will recheck  - Hgb A1C w/o eAG  4. Candida vaginitis Amoret all topical creams, take medications as prescribed  - fluconazole  (DIFLUCAN ) 150 MG tablet; Take 1 tablet (150 mg total) by mouth daily.  Dispense: 3 tablet; Refill: 0  5. Mixed hyperlipidemia Will monitor, if needed will start statins    General Counseling: Shaleigh verbalizes understanding of the findings of todays visit and agrees with plan of treatment. I have discussed any further diagnostic evaluation that may be needed or ordered today. We also reviewed her medications today. she has been  encouraged to call the office with any questions or concerns that should arise related to todays visit.    Counseling:   Controlled Substance Database was reviewed by me.  Orders Placed This Encounter  Procedures   ANA Direct w/Reflex if Positive   CYCLIC CITRUL PEPTIDE ANTIBODY, IGG/IGA   Sed Rate (ESR)   Hgb A1C w/o eAG    Meds ordered this encounter  Medications   fluconazole  (DIFLUCAN ) 150 MG tablet    Sig: Take 1 tablet (150 mg total) by mouth daily.    Dispense:  3 tablet    Refill:  0    Time spent:35 Minutes

## 2024-07-08 ENCOUNTER — Ambulatory Visit

## 2024-07-21 ENCOUNTER — Ambulatory Visit: Admitting: Internal Medicine

## 2024-07-21 ENCOUNTER — Encounter: Payer: Self-pay | Admitting: Internal Medicine

## 2024-07-21 VITALS — BP 110/70 | HR 90 | Temp 98.0°F | Resp 16 | Ht 61.0 in | Wt 145.0 lb

## 2024-07-21 DIAGNOSIS — Z0001 Encounter for general adult medical examination with abnormal findings: Secondary | ICD-10-CM | POA: Diagnosis not present

## 2024-07-21 DIAGNOSIS — R300 Dysuria: Secondary | ICD-10-CM | POA: Diagnosis not present

## 2024-07-21 DIAGNOSIS — N952 Postmenopausal atrophic vaginitis: Secondary | ICD-10-CM | POA: Diagnosis not present

## 2024-07-21 DIAGNOSIS — Z124 Encounter for screening for malignant neoplasm of cervix: Secondary | ICD-10-CM | POA: Diagnosis not present

## 2024-07-21 DIAGNOSIS — N898 Other specified noninflammatory disorders of vagina: Secondary | ICD-10-CM | POA: Diagnosis not present

## 2024-07-21 DIAGNOSIS — Z113 Encounter for screening for infections with a predominantly sexual mode of transmission: Secondary | ICD-10-CM

## 2024-07-21 MED ORDER — ESTRADIOL 0.05 MG/24HR TD PTWK: MEDICATED_PATCH | TRANSDERMAL | 12 refills | Status: AC

## 2024-07-21 NOTE — Progress Notes (Unsigned)
 " [facility] [address] [place], [place] [address]  Internal MEDICINE  Office Visit Note  Patient Name: Roberta Ward  [id number]  [id number]  Date of Service: 08/03/2024  Chief Complaint  Patient presents with   Medicare Wellness   Quality Metric Gaps    Shingles vaccines     HPI Pt is here for routine health maintenance examination She feels well no, able to stop all the topical ointments and creams for her vaginal itching She does continue to complain and seems pretty uncomfortable Patient is a on Prolia for osteoporosis GERD symptoms are under control as well  Current Medication: Outpatient Encounter Medications as of 07/21/2024  Medication Sig   Ascorbic Acid (VITAMIN C) 1000 MG tablet Take 1,000 mg by mouth daily.   denosumab (PROLIA) 60 MG/ML SOSY injection Inject into the skin. q6 months   estradiol  (CLIMARA  - DOSED IN MG/24 HR) 0.05 mg/24hr patch Use one patch on clean skin every 5 days   fluconazole  (DIFLUCAN ) 150 MG tablet Take 1 tablet (150 mg total) by mouth daily.   omeprazole  (PRILOSEC) 20 MG capsule Take 1 capsule (20 mg total) by mouth daily.   [DISCONTINUED] clobetasol  cream (TEMOVATE ) 0.05 % Apply 1 Application topically 2 (two) times daily.   [DISCONTINUED] estradiol  (ESTRACE ) 0.1 MG/GM vaginal cream Place 1 Applicatorful vaginally daily.   No facility-administered encounter medications on file as of 07/21/2024.    Surgical History: Past Surgical History:  Procedure Laterality Date   ABDOMINAL HYSTERECTOMY     KNEE CARTILAGE SURGERY Left    PARTIAL HYSTERECTOMY  1980   Rt ovary still present and cervix still present.   TONSILECTOMY/ADENOIDECTOMY WITH MYRINGOTOMY      Medical History: Past Medical History:  Diagnosis Date   Arthritis     Family History: Family History  Problem Relation Age of Onset   Breast cancer Mother    Breast cancer Maternal Aunt     Social History: Social History   Socioeconomic History    Marital status: Married    Spouse name: Orlando    Number of children: 1   Years of education: Not on file   Highest education level: Not on file  Occupational History   Not on file  Tobacco Use   Smoking status: Never   Smokeless tobacco: Never  Vaping Use   Vaping status: Never Used  Substance and Sexual Activity   Alcohol use: Yes    Comment: rare- occasional   Drug use: Never   Sexual activity: Not Currently  Other Topics Concern   Not on file  Social History Narrative   Not on file   Social Drivers of Health   Tobacco Use: Low Risk (07/21/2024)   Patient History    Smoking Tobacco Use: Never    Smokeless Tobacco Use: Never    Passive Exposure: Not on file  Financial Resource Strain: Low Risk  (03/04/2024)   Received from [facility]   Overall Financial Resource Strain (CARDIA)    Difficulty of Paying Living Expenses: Not hard at all  Food Insecurity: No Food Insecurity (06/04/2024)   Received from [facility]   Epic    Within the past 12 months, you worried that your food would run out before you got the money to buy more.: Never true    Within the past 12 months, the food you bought just didn't last and you didn't have money to get more.: Never true  Transportation Needs: No Transportation Needs (  06/04/2024)   Received from [facility]   PRAPARE - Transportation    In the past 12 months, has lack of transportation kept you from medical appointments or from getting medications?: No    Lack of Transportation (Non-Medical): No  Physical Activity: Insufficiently Active (05/22/2023)   Exercise Vital Sign    Days of Exercise per Week: 3 days    Minutes of Exercise per Session: 30 min  Stress: No Stress Concern Present (05/22/2023)   [employer] of Occupational Health - Occupational Stress Questionnaire    Feeling of Stress : Not at all  Social Connections: Moderately Integrated (05/22/2023)   Social  Connection and Isolation Panel    Frequency of Communication with Friends and Family: More than three times a week    Frequency of Social Gatherings with Friends and Family: Three times a week    Attends Religious Services: More than 4 times per year    Active Member of Clubs or Organizations: No    Attends Banker Meetings: Never    Marital Status: Married  Depression (PHQ2-9): Low Risk (07/21/2024)   Depression (PHQ2-9)    PHQ-2 Score: 0  Recent Concern: Depression (PHQ2-9) - Medium Risk (04/30/2024)   Depression (PHQ2-9)    PHQ-2 Score: 5  Alcohol Screen: Low Risk (07/21/2024)   Alcohol Screen    Last Alcohol Screening Score (AUDIT): 1  Housing: Low Risk  (03/04/2024)   Received from [facility]   Epic    In the last 12 months, was there a time when you were not able to pay the mortgage or rent on time?: No    In the past 12 months, how many times have you moved where you were living?: 0    At any time in the past 12 months, were you homeless or living in a shelter (including now)?: No  Utilities: Not At Risk (03/04/2024)   Received from [facility]   Epic    In the past 12 months has the electric, gas, oil, or water company threatened to shut off services in your home?: No  Health Literacy: Adequate Health Literacy (05/22/2023)   B1300 Health Literacy    Frequency of need for help with medical instructions: Never      Review of Systems  Constitutional:  Negative for chills, fatigue, fever and unexpected weight change.  HENT:  Negative for congestion, mouth sores, postnasal drip, rhinorrhea, sneezing and sore throat.   Eyes:  Negative for redness.  Respiratory:  Negative for cough, chest tightness and shortness of breath.   Cardiovascular:  Negative for chest pain and palpitations.  Gastrointestinal:  Negative for abdominal pain, constipation, diarrhea, nausea and vomiting.  Genitourinary:  Positive for genital sores. Negative  for dysuria, flank pain and frequency.  Musculoskeletal:  Negative for arthralgias, back pain, joint swelling and neck pain.  Skin:  Negative for rash.  Neurological: Negative.  Negative for tremors and numbness.  Hematological:  Negative for adenopathy. Does not bruise/bleed easily.  Psychiatric/Behavioral: Negative.  Negative for behavioral problems (Depression), sleep disturbance and suicidal ideas. The patient is not nervous/anxious.      Vital Signs: BP 110/70   Pulse 90   Temp 98 F (36.7 C)   Resp 16   Ht 5' 1 (1.549 m)   Wt 145 lb (65.8 kg)   SpO2 96%   BMI 27.40 kg/m    Physical Exam Constitutional:      General: She is not in  acute distress.    Appearance: She is well-developed. She is not diaphoretic.  HENT:     Head: Normocephalic and atraumatic.     Right Ear: External ear normal.     Left Ear: External ear normal.     Nose: Nose normal.     Mouth/Throat:     Pharynx: No oropharyngeal exudate.  Eyes:     General: No scleral icterus.       Right eye: No discharge.        Left eye: No discharge.     Conjunctiva/sclera: Conjunctivae normal.     Pupils: Pupils are equal, round, and reactive to light.  Neck:     Thyroid: No thyromegaly.     Vascular: No JVD.     Trachea: No tracheal deviation.  Cardiovascular:     Rate and Rhythm: Normal rate and regular rhythm.     Heart sounds: Normal heart sounds. No murmur heard.    No friction rub. No gallop.  Pulmonary:     Effort: Pulmonary effort is normal. No respiratory distress.     Breath sounds: Normal breath sounds. No stridor. No wheezing or rales.  Chest:     Chest wall: No tenderness.  Abdominal:     General: Bowel sounds are normal. There is no distension.     Palpations: Abdomen is soft. There is no mass.     Tenderness: There is no abdominal tenderness. There is no guarding or rebound.  Genitourinary:    General: Normal vulva.     Vagina: No vaginal discharge.     Comments: Patient does have  excessive dryness in perivaginal area perivaginal  Musculoskeletal:        General: No tenderness or deformity. Normal range of motion.     Cervical back: Normal range of motion and neck supple.  Lymphadenopathy:     Cervical: No cervical adenopathy.  Skin:    General: Skin is warm and dry.     Coloration: Skin is not pale.     Findings: No erythema or rash.  Neurological:     Mental Status: She is alert.     Cranial Nerves: No cranial nerve deficit.     Motor: No abnormal muscle tone.     Coordination: Coordination normal.     Deep Tendon Reflexes: Reflexes are normal and symmetric.  Psychiatric:        Behavior: Behavior normal.        Thought Content: Thought content normal.        Judgment: Judgment normal.      LABS: Recent Results (from the past 2160 hours)  UA/M w/rflx Culture, Routine     Status: None   Collection Time: 07/21/24  9:59 AM   Specimen: Urine   Urine  Result Value Ref Range   Specific Gravity, UA 1.008 1.005 - 1.030   pH, UA 6.0 5.0 - 7.5   Color, UA Yellow Yellow   Appearance Ur Clear Clear   Leukocytes,UA Negative Negative   Protein,UA Negative Negative/Trace   Glucose, UA Negative Negative   Ketones, UA Negative Negative   RBC, UA Negative Negative   Bilirubin, UA Negative Negative   Urobilinogen, Ur 0.2 0.2 - 1.0 mg/dL   Nitrite, UA Negative Negative   Microscopic Examination Comment     Comment: Microscopic follows if indicated.   Microscopic Examination See below:     Comment: Microscopic was indicated and was performed.   Urinalysis Reflex Comment     Comment: This  specimen will not reflex to a Urine Culture.  IGP, Aptima HPV     Status: None   Collection Time: 07/21/24  9:59 AM  Result Value Ref Range   Interpretation NILM     Comment: NEGATIVE FOR INTRAEPITHELIAL LESION OR MALIGNANCY.   Category NIL     Comment: Negative for Intraepithelial Lesion   Adequacy SDER     Comment: Satisfactory for evaluation.   Clinician Provided  ICD10 Comment     Comment: Z12.4   Performed by: Comment     Comment: Aldona Farr, Cytotechnologist (ASCP)   Note: Comment     Comment: The Pap smear is a screening test designed to aid in the detection of premalignant and malignant conditions of the uterine cervix.  It is not a diagnostic procedure and should not be used as the sole means of detecting cervical cancer.  Both false-positive and false-negative reports do occur.    Test Methodology Comment     Comment: This liquid based ThinPrep(R) pap test was interpreted using the Hologic(R) Genius(TM) Cervical Algorithm whole slide imaging system.    HPV Aptima Negative Negative    Comment: This nucleic acid amplification test detects fourteen high-risk HPV types (16,18,31,33,35,39,45,51,52,56,58,59,66,68) without differentiation.   NuSwab Vaginitis Plus (VG+)     Status: None   Collection Time: 07/21/24  9:59 AM  Result Value Ref Range   Atopobium vaginae Low - 0 Score   BVAB 2 Low - 0 Score   Megasphaera 1 Low - 0 Score    Comment: Calculate total score by adding the 3 individual bacterial vaginosis (BV) marker scores together.  Total score is interpreted as follows: Total score 0-1: Indicates the absence of BV. Total score   2: Indeterminate for BV. Additional clinical                  data should be evaluated to establish a                  diagnosis. Total score 3-6: Indicates the presence of BV.    Candida albicans, NAA Negative Negative   Candida glabrata, NAA Negative Negative   Trich vag by NAA Negative Negative   Chlamydia trachomatis, NAA Negative Negative   Neisseria gonorrhoeae, NAA Negative Negative  Microscopic Examination     Status: None   Collection Time: 07/21/24  9:59 AM   Urine  Result Value Ref Range   WBC, UA None seen 0 - 5 /hpf   RBC, Urine None seen 0 - 2 /hpf   Epithelial Cells (non renal) None seen 0 - 10 /hpf   Casts None seen None seen /lpf   Bacteria, UA None seen None seen/Few        Assessment/Plan: 1. Encounter for general adult medical examination with abnormal findings (Primary) Preventative health maintenance is updated  2. Itching in the vaginal area Excessive dryness noted, patient might need to see dermatology - clotrimazole -betamethasone  (LOTRISONE ) cream; Apply 1 Application topically daily.  Dispense: 30 g; Refill: 0  3. Routine cervical smear Cervical exam was normal - IGP, Aptima HPV  4. Screening for STDs (sexually transmitted diseases) - NuSwab Vaginitis Plus (VG+)  5. Atrophic vaginitis Avoid any intravaginal, HRT as prescribed today - estradiol  (CLIMARA  - DOSED IN MG/24 HR) 0.05 mg/24hr patch; Use one patch on clean skin every 5 days  Dispense: 6 patch; Refill: 12  6. Dysuria - UA/M w/rflx Culture, Routine - Microscopic Examination  General Counseling: Cidney verbalizes understanding of the findings  of todays visit and agrees with plan of treatment. I have discussed any further diagnostic evaluation that may be needed or ordered today. We also reviewed her medications today. she has been encouraged to call the office with any questions or concerns that should arise related to todays visit.    Counseling:  [place] Controlled Substance Database was reviewed by me.  Orders Placed This Encounter  Procedures   Microscopic Examination   UA/M w/rflx Culture, Routine   NuSwab Vaginitis Plus (VG+)    Meds ordered this encounter  Medications   estradiol  (CLIMARA  - DOSED IN MG/24 HR) 0.05 mg/24hr patch    Sig: Use one patch on clean skin every 5 days    Dispense:  6 patch    Refill:  12    Total time spent:35 Minutes  Time spent includes review of chart, medications, test results, and follow up plan with the patient.     Sigrid CHRISTELLA Bathe, MD  Internal Medicine  "

## 2024-07-22 LAB — UA/M W/RFLX CULTURE, ROUTINE
Bilirubin, UA: NEGATIVE
Glucose, UA: NEGATIVE
Ketones, UA: NEGATIVE
Leukocytes,UA: NEGATIVE
Nitrite, UA: NEGATIVE
Protein,UA: NEGATIVE
RBC, UA: NEGATIVE
Specific Gravity, UA: 1.008 (ref 1.005–1.030)
Urobilinogen, Ur: 0.2 mg/dL (ref 0.2–1.0)
pH, UA: 6 (ref 5.0–7.5)

## 2024-07-22 LAB — MICROSCOPIC EXAMINATION
Bacteria, UA: NONE SEEN
Casts: NONE SEEN /LPF
Epithelial Cells (non renal): NONE SEEN /HPF (ref 0–10)
RBC, Urine: NONE SEEN /HPF (ref 0–2)
WBC, UA: NONE SEEN /HPF (ref 0–5)

## 2024-07-23 LAB — NUSWAB VAGINITIS PLUS (VG+)
Candida albicans, NAA: NEGATIVE
Candida glabrata, NAA: NEGATIVE
Chlamydia trachomatis, NAA: NEGATIVE
Neisseria gonorrhoeae, NAA: NEGATIVE
Trich vag by NAA: NEGATIVE

## 2024-07-24 LAB — IGP, APTIMA HPV: HPV Aptima: NEGATIVE

## 2024-08-03 ENCOUNTER — Telehealth: Payer: Self-pay | Admitting: Physician Assistant

## 2024-08-03 ENCOUNTER — Ambulatory Visit: Payer: Self-pay | Admitting: Internal Medicine

## 2024-08-03 MED ORDER — CLOTRIMAZOLE-BETAMETHASONE 1-0.05 % EX CREA: 1.0000 | TOPICAL_CREAM | Freq: Every day | CUTANEOUS | 0 refills | Status: AC

## 2024-08-03 NOTE — Progress Notes (Signed)
 Pap and all tests are normal, please advise her

## 2024-08-03 NOTE — Telephone Encounter (Signed)
 Pt notified that we sent cream used small amount with Vaseline

## 2024-08-03 NOTE — Telephone Encounter (Signed)
 error

## 2024-08-04 NOTE — Telephone Encounter (Signed)
 Pt.notified

## 2024-08-04 NOTE — Telephone Encounter (Signed)
-----   Message from Sigrid Bathe, MD sent at 08/03/2024 11:19 AM EST ----- Pap and all tests are normal, please advise her

## 2024-09-17 ENCOUNTER — Ambulatory Visit: Admitting: Physician Assistant

## 2025-07-22 ENCOUNTER — Ambulatory Visit: Admitting: Physician Assistant
